# Patient Record
Sex: Male | Born: 1975 | Race: White | Hispanic: No | Marital: Married | Smoking: Never smoker
Health system: Southern US, Community
[De-identification: ages and names within clinical notes are randomized; demographics above are authoritative.]

## PROBLEM LIST (undated history)

## (undated) DIAGNOSIS — M94261 Chondromalacia, right knee: Secondary | ICD-10-CM

## (undated) DIAGNOSIS — J42 Unspecified chronic bronchitis: Secondary | ICD-10-CM

## (undated) DIAGNOSIS — J45909 Unspecified asthma, uncomplicated: Secondary | ICD-10-CM

## (undated) DIAGNOSIS — M545 Low back pain, unspecified: Secondary | ICD-10-CM

## (undated) DIAGNOSIS — S83249A Other tear of medial meniscus, current injury, unspecified knee, initial encounter: Secondary | ICD-10-CM

## (undated) DIAGNOSIS — M47815 Spondylosis without myelopathy or radiculopathy, thoracolumbar region: Secondary | ICD-10-CM

## (undated) DIAGNOSIS — F419 Anxiety disorder, unspecified: Secondary | ICD-10-CM

## (undated) DIAGNOSIS — I10 Essential (primary) hypertension: Secondary | ICD-10-CM

## (undated) DIAGNOSIS — J189 Pneumonia, unspecified organism: Secondary | ICD-10-CM

## (undated) DIAGNOSIS — G8929 Other chronic pain: Secondary | ICD-10-CM

## (undated) DIAGNOSIS — M479 Spondylosis, unspecified: Secondary | ICD-10-CM

## (undated) HISTORY — PX: BACK SURGERY: SHX140

## (undated) HISTORY — DX: Unspecified asthma, uncomplicated: J45.909

## (undated) HISTORY — DX: Anxiety disorder, unspecified: F41.9

## (undated) HISTORY — PX: WISDOM TOOTH EXTRACTION: SHX21

## (undated) HISTORY — PX: VASECTOMY: SHX75

---
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
## 2011-12-02 ENCOUNTER — Other Ambulatory Visit: Payer: Self-pay | Admitting: Orthopedic Surgery

## 2011-12-02 ENCOUNTER — Ambulatory Visit
Admission: RE | Admit: 2011-12-02 | Discharge: 2011-12-02 | Disposition: A | Discharge status: Home / Self Care | Payer: BC Managed Care – PPO | Source: Ambulatory Visit | Attending: Orthopedic Surgery | Admitting: Orthopedic Surgery

## 2011-12-02 DIAGNOSIS — Z77018 Contact with and (suspected) exposure to other hazardous metals: Secondary | ICD-10-CM

## 2013-06-08 ENCOUNTER — Encounter: Payer: Self-pay | Admitting: Neurology

## 2013-06-12 ENCOUNTER — Encounter: Payer: Self-pay | Admitting: Neurology

## 2013-06-12 ENCOUNTER — Ambulatory Visit (INDEPENDENT_AMBULATORY_CARE_PROVIDER_SITE_OTHER): Payer: 59 | Admitting: Neurology

## 2013-06-12 VITALS — BP 156/99 | HR 86 | Temp 96.4°F | Ht 70.0 in | Wt 192.0 lb

## 2013-06-12 DIAGNOSIS — R51 Headache: Secondary | ICD-10-CM

## 2013-06-12 DIAGNOSIS — G4733 Obstructive sleep apnea (adult) (pediatric): Secondary | ICD-10-CM

## 2013-06-12 DIAGNOSIS — G2581 Restless legs syndrome: Secondary | ICD-10-CM

## 2013-06-12 DIAGNOSIS — R519 Headache, unspecified: Secondary | ICD-10-CM

## 2013-06-12 DIAGNOSIS — M549 Dorsalgia, unspecified: Secondary | ICD-10-CM

## 2013-06-12 DIAGNOSIS — F1120 Opioid dependence, uncomplicated: Secondary | ICD-10-CM

## 2013-06-12 DIAGNOSIS — G8929 Other chronic pain: Secondary | ICD-10-CM

## 2013-06-12 DIAGNOSIS — R351 Nocturia: Secondary | ICD-10-CM

## 2013-06-12 DIAGNOSIS — F1190 Opioid use, unspecified, uncomplicated: Secondary | ICD-10-CM

## 2013-06-12 DIAGNOSIS — G4761 Periodic limb movement disorder: Secondary | ICD-10-CM

## 2013-06-12 DIAGNOSIS — J45909 Unspecified asthma, uncomplicated: Secondary | ICD-10-CM

## 2013-06-12 NOTE — Patient Instructions (Addendum)
Based on your symptoms and your exam I believe you are at risk for obstructive sleep apnea or OSA, and I think we should proceed with a sleep study to determine whether you do or do not have OSA and how severe it is. If you have more than mild OSA, I want you to consider treatment with CPAP. Please remember, the risks and ramifications of moderate to severe obstructive sleep apnea or OSA are: Cardiovascular disease, including congestive heart failure, stroke, difficult to control hypertension, arrhythmias, and even type 2 diabetes has been linked to untreated OSA. Sleep apnea causes disruption of sleep and sleep deprivation in most cases, which, in turn, can cause recurrent headaches, problems with memory, mood, concentration, focus, and vigilance. Most people with untreated sleep apnea report excessive daytime sleepiness, which can affect their ability to drive. Please do not drive if you feel sleepy.  I will see you back after your sleep study to go over the test results and where to go from there. We will call you after your sleep study and to set up an appointment at the time.   Cut back on your caffeine intake and consider stopping alcohol altogether as you are on narcotic pain medicine.
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
## 2013-07-04 ENCOUNTER — Telehealth: Payer: Self-pay | Admitting: *Deleted

## 2013-07-04 NOTE — Telephone Encounter (Signed)
Message copied by Daryll DrownHEUGLY, Flossie Wexler B on Wed Jul 04, 2013  2:26 PM ------      Message from: Waldron LabsLILLY, KISSA K      Created: Wed Jul 04, 2013  8:25 AM      Regarding: RE: Status on peer to peer       He has one of the [employer] insurance plans in which their policy requires a referral from the pcp for any visit to another office.  This plan is called a [employer] plan.  The pcp must visit the Comp[employer] online portal and enter the referral information and obtain a reference number.  The front desk is aware to flag the Compass insurance plans when the patient checks in and presents their insurance card.  However, he did not have his insurance card for his initial visit so they were unaware.  Since he was not referred by his pcp, but referred by Dr. Murray HodgkinsBartko, no referral was obtained.  According to billing, when the claim for payment for his consultation is submitted to [facility], it will then be rejected and the cost will become the patients responsibility.  For his visit, that cost will be $425.  It was not discovered that he had a Compass plan until [facility] denied the attended sleep study so I attempted to get coverage information for a HST, and was informed by the representative.  Per Dorathy DaftKayla, in billing - these denied claims will become those patients responsibility, however, you can check with Angie to see if there is any type of discount available.  His appointment was scheduled for 4/10 and he still does not know that its been canceled or received any type of information from our office regarding this - hope he handles it okay.      ----- Message -----         From: Bonita QuinShawnee Sabir Charters, RPSGT         Sent: 07/04/2013   7:50 AM           To: Berniece SalinesKissa K Lilly      Subject: RE: Status on peer to peer                               Good morning, so I need to contact this patient today.  Could you please tell me the details again so I can be very clear in speaking with him about it.  This is my understanding, I just want to clarify:   -He has a type of [employer] which requires an online referral for every service which must be entered in an online portal by his primary care physician who is basically the gatekeeper for ALL services.      -He may not have known that and we didn't either because we haven't worked with this plan until recently.  He didn't have his insurance card and that further complicated things.      - Now his visit with Dr. Frances FurbishAthar has been denied by his insurance because the online referral wasn't entered prior to the visit.      - Responsibility for this visit which is upwards of $400 has been transferred to him.      - Is there any way we can reduce this amount...convert it to a cash pay amount or something like that after the fact??      - In order to get anything covered in the future, he would be required to  see his PCP first who would then have to enter the referral in the online portal.  Do you know if there's any way we can verify when a referral has been entered for him prior to any services he has in the future?      - Also, when did we find this out?  Was it all on the phone when we were trying to get a prior auth for the sleep study?            Thanks for any answers you can help me with, I just want to be as clear as I can be while I have him on the phone.      ----- Message -----         From: [employer]         Sent: 06/29/2013  12:00 PM           To: Bonita Quin, RPSGT      Subject: RE: Status on peer to peer                               No, they don't allow [place] to do that once they've made a decision on those particular procedure codes.  We have to do a peer to peer or the patient can file an appeal, but the appeal has to be submitted in writing and it can be a lengthy process.      ----- Message -----         From: Bonita Quin, RPSGT         Sent: 06/29/2013   6:58 AM           To: [employer]      Subject: RE: Status on peer to peer                               I know you left it on my  desk but I haven't heard anything back from Dr. Frances Furbish.  I was out of office Wednesday when she was reading and I can ask her today during read time what she would like to do with it.  If it is too late, do you know if we are able to resubmit the auth request drawing attention to this information initially before a second decision is made?      ----- Message -----         From: [employer]         Sent: 06/28/2013   2:55 PM           To: Bonita Quin, RPSGT      Subject: Status on peer to peer                                   Do you know the status on this patient?  It may be too late for a peer to peer.  Just checking because his appt is scheduled for next week and he has not been notified by our office about anything.                           ------

## 2013-07-04 NOTE — Telephone Encounter (Signed)
Called and spoke with Steven Murillo and she stated they had just become aware of this entire Comp[employer] process.  I explained that Steven Murillo would be calling to explain the cash pay discount for the visit which had already occurred and that we are able to accept payment arrangements if needed.  She said Steven Murillo has to have an MRI, and they want to do back surgery.  She said the auth process for the MRI took about a week after the PCP had entered it.  I explained that once the PCP had entered the referral into the portal, that we could try again with the authorization process and then reschedule.  I also explained we would do all we could to expedite his scheduling knowing he will have to have the sleep study before any surgery can be completed.  I can take care of his study on a Sunday night if needed.  They are appreciative of our efforts and they will call Steven Murillo once they can confirm the referral has been entered into the [employer] provider portal.

## 2013-09-20 ENCOUNTER — Other Ambulatory Visit: Payer: Self-pay | Admitting: Orthopaedic Surgery

## 2013-09-21 ENCOUNTER — Encounter: Payer: Self-pay | Admitting: Internal Medicine

## 2013-09-21 ENCOUNTER — Encounter (HOSPITAL_BASED_OUTPATIENT_CLINIC_OR_DEPARTMENT_OTHER): Payer: Self-pay | Admitting: *Deleted

## 2013-09-21 ENCOUNTER — Ambulatory Visit (INDEPENDENT_AMBULATORY_CARE_PROVIDER_SITE_OTHER): Payer: 59 | Admitting: Internal Medicine

## 2013-09-21 VITALS — BP 142/78 | HR 84 | Temp 97.5°F | Ht 68.0 in | Wt 189.0 lb

## 2013-09-21 DIAGNOSIS — M549 Dorsalgia, unspecified: Secondary | ICD-10-CM

## 2013-09-21 DIAGNOSIS — R5381 Other malaise: Secondary | ICD-10-CM

## 2013-09-21 DIAGNOSIS — R0989 Other specified symptoms and signs involving the circulatory and respiratory systems: Secondary | ICD-10-CM

## 2013-09-21 DIAGNOSIS — R5383 Other fatigue: Secondary | ICD-10-CM

## 2013-09-21 DIAGNOSIS — M25561 Pain in right knee: Secondary | ICD-10-CM

## 2013-09-21 DIAGNOSIS — J4530 Mild persistent asthma, uncomplicated: Secondary | ICD-10-CM

## 2013-09-21 DIAGNOSIS — M25569 Pain in unspecified knee: Secondary | ICD-10-CM

## 2013-09-21 DIAGNOSIS — R0609 Other forms of dyspnea: Secondary | ICD-10-CM

## 2013-09-21 DIAGNOSIS — J45909 Unspecified asthma, uncomplicated: Secondary | ICD-10-CM

## 2013-09-21 DIAGNOSIS — R0683 Snoring: Secondary | ICD-10-CM

## 2013-09-21 DIAGNOSIS — G8929 Other chronic pain: Secondary | ICD-10-CM

## 2013-09-21 MED ORDER — CELECOXIB 200 MG PO CAPS
200.0000 mg | ORAL_CAPSULE | Freq: Every day | ORAL | Status: DC
Start: 2013-09-21 — End: 2013-10-23

## 2013-09-21 NOTE — Assessment & Plan Note (Signed)
On albuterol and advair No current issues

## 2013-09-21 NOTE — Assessment & Plan Note (Signed)
Waiting for surgery Needs sleep study prior Will refer to pulm

## 2013-09-21 NOTE — Progress Notes (Signed)
HPI  Pt presents to the clinic today to establish care. [facility] He reports he has a torn meniscus of the right knee. He is supposed to have surgery next week. He also has a history of chronic back pain s/p ruptured disk at age 38. He did have surgical intervention at this time but has had worsening pain over the last few years. He will be having repeat back surgery after his knee surgery but his surgeon request that he be referred for a sleep study prior to scheduling the back surgery. His partner reports that he does snore, falls asleep easily during the day and has stopped breathing at night. He will also need a refill of his celebrex today. Other than that, he has no concerns.  Flu: 12/2012 Tetanus: 2013 Eye Doctor: as needed Dentist: as needed  Past Medical History  Diagnosis Date  . Spinal stenosis, lumbar region, without neurogenic claudication   . Thoracic or lumbosacral neuritis or radiculitis, unspecified   . Displacement of lumbar intervertebral disc without myelopathy   . Opioid type dependence, continuous   . Snoring   . Asthma   . Insomnia   . Anxiety   . Chronic back pain 06/12/2013  . Chronic, continuous use of opioids 06/12/2013  . Alcohol abuse     Current Outpatient Prescriptions  Medication Sig Dispense Refill  . ALBUTEROL IN Inhale 1 spray into the lungs as needed.      . celecoxib (CELEBREX) 200 MG capsule Take 200 mg by mouth daily.      . Citalopram Hydrobromide (CELEXA PO) Take 1 tablet by mouth. Take 1 tab (40 mg) daily      . Fluticasone-Salmeterol (ADVAIR HFA IN) Inhale 1 spray into the lungs as needed.      . OxyCODONE HCl (OXYCONTIN PO) Take 1 tablet by mouth as needed.       No current facility-administered medications for this visit.    No Known Allergies  Family History  Problem Relation Age of Onset  . Sleep apnea Father   . Diabetes Father   . Heart disease Father   . Alcohol abuse Brother   .  Alcohol abuse Paternal Grandfather     History   Social History  . Marital Status: Married    Spouse Name: N/A    Number of Children: N/A  . Years of Education: N/A   Occupational History  . Not on file.   Social History Main Topics  . Smoking status: Never Smoker   . Smokeless tobacco: Never Used  . Alcohol Use: No  . Drug Use: Yes    Special: Marijuana  . Sexual Activity: Yes   Other Topics Concern  . Not on file   Social History Narrative  . No narrative on file    ROS:  Constitutional: Pt reports fatigue. Denies fever, malaise,  headache or abrupt weight changes.  Musculoskeletal: Pt reports right knee pain and chronic back pain. Denies muscle pain.  Psych: Pt reports anxiety. Denies depression, SI/HI.  No other specific complaints in a complete review of systems (except as listed in HPI above).  PE:  BP 142/78  Pulse 84  Temp(Src) 97.5 F (36.4 C) (Oral)  Ht 5\' 8"  (1.727 m)  Wt 189 lb (85.73 kg)  BMI 28.74 kg/m2  SpO2 99% Wt Readings from Last 3 Encounters:  09/21/13 189 lb (85.73 kg)  06/12/13 192 lb (87.091 kg)    General: Appears his stated age,  well developed, well nourished in NAD. Cardiovascular: Normal rate and rhythm. S1,S2 noted.  No murmur, rubs or gallops noted. No JVD or BLE edema. No carotid bruits noted. Pulmonary/Chest: Normal effort and positive vesicular breath sounds. No respiratory distress. No wheezes, rales or ronchi noted.  Musculoskeletal: Right knee is in a brace. Decreased flexion and extension of the pain. Ataxic gait. Strength 4/5 RLE, 5/5 LLE. Psychiatric: Mood anxiousand affect flat. Behavior is normal. Judgment and thought content normal.     Assessment and Plan:  Fatigue, snoring:  Will refer to pulmonology for possible sleep study.  RTC as needed

## 2013-09-21 NOTE — Patient Instructions (Addendum)
Sleep Apnea  Sleep apnea is a sleep disorder characterized by abnormal pauses in breathing while you sleep. When your breathing pauses, the level of oxygen in your blood decreases. This causes you to move out of deep sleep and into light sleep. As a result, your quality of sleep is poor, and the system that carries your blood throughout your body (cardiovascular system) experiences stress. If sleep apnea remains untreated, the following conditions can develop:  High blood pressure (hypertension).  Coronary artery disease.  Inability to achieve or maintain an erection (impotence).  Impairment of your thought process (cognitive dysfunction). There are three types of sleep apnea: 1. Obstructive sleep apnea--Pauses in breathing during sleep because of a blocked airway. 2. Central sleep apnea--Pauses in breathing during sleep because the area of the brain that controls your breathing does not send the correct signals to the muscles that control breathing. 3. Mixed sleep apnea--A combination of both obstructive and central sleep apnea. RISK FACTORS The following risk factors can increase your risk of developing sleep apnea:  Being overweight.  Smoking.  Having narrow passages in your nose and throat.  Being of older age.  Being male.  Alcohol use.  Sedative and tranquilizer use.  Ethnicity. Among individuals younger than 35 years, African Americans are at increased risk of sleep apnea. SYMPTOMS   Difficulty staying asleep.  Daytime sleepiness and fatigue.  Loss of energy.  Irritability.  Loud, heavy snoring.  Morning headaches.  Trouble concentrating.  Forgetfulness.  Decreased interest in sex. DIAGNOSIS  In order to diagnose sleep apnea, your caregiver will perform a physical examination. Your caregiver may suggest that you take a home sleep test. Your caregiver may also recommend that you spend the night in a sleep lab. In the sleep lab, several monitors record  information about your heart, lungs, and brain while you sleep. Your leg and arm movements and blood oxygen level are also recorded. TREATMENT The following actions may help to resolve mild sleep apnea:  Sleeping on your side.   Using a decongestant if you have nasal congestion.   Avoiding the use of depressants, including alcohol, sedatives, and narcotics.   Losing weight and modifying your diet if you are overweight. There also are devices and treatments to help open your airway:  Oral appliances. These are custom-made mouthpieces that shift your lower jaw forward and slightly open your bite. This opens your airway.  Devices that create positive airway pressure. This positive pressure "splints" your airway open to help you breathe better during sleep. The following devices create positive airway pressure:  Continuous positive airway pressure (CPAP) device. The CPAP device creates a continuous level of air pressure with an air pump. The air is delivered to your airway through a mask while you sleep. This continuous pressure keeps your airway open.  Nasal expiratory positive airway pressure (EPAP) device. The EPAP device creates positive air pressure as you exhale. The device consists of single-use valves, which are inserted into each nostril and held in place by adhesive. The valves create very little resistance when you inhale but create much more resistance when you exhale. That increased resistance creates the positive airway pressure. This positive pressure while you exhale keeps your airway open, making it easier to breath when you inhale again.  Bilevel positive airway pressure (BPAP) device. The BPAP device is used mainly in patients with central sleep apnea. This device is similar to the CPAP device because it also uses an air pump to deliver continuous air pressure   through a mask. However, with the BPAP machine, the pressure is set at two different levels. The pressure when you  exhale is lower than the pressure when you inhale.  Surgery. Typically, surgery is only done if you cannot comply with less invasive treatments or if the less invasive treatments do not improve your condition. Surgery involves removing excess tissue in your airway to create a wider passage way. Document Released: 03/05/2002 Document Revised: 07/10/2012 Document Reviewed: 07/22/2011 [employer] Patient Information 2015 [employer]. This information is not intended to replace advice given to you by your health care provider. Make sure you discuss any questions you have with your health care provider.  

## 2013-09-21 NOTE — Assessment & Plan Note (Signed)
Scheduled for surgery next week Torn meniscus On oxycodone Will refill celebrex today

## 2013-09-21 NOTE — Pre-Procedure Instructions (Signed)
Bring all medications especially inhalers. Pack an overnight bag in case needs to stay the night - questionable sleep apnea. Pt to have a sleep study end of July.

## 2013-09-21 NOTE — Progress Notes (Signed)
Pre visit review using our clinic review tool, if applicable. No additional management support is needed unless otherwise documented below in the visit note. 

## 2013-09-26 DIAGNOSIS — S83249A Other tear of medial meniscus, current injury, unspecified knee, initial encounter: Secondary | ICD-10-CM

## 2013-09-26 DIAGNOSIS — M94261 Chondromalacia, right knee: Secondary | ICD-10-CM

## 2013-09-26 HISTORY — DX: Other tear of medial meniscus, current injury, unspecified knee, initial encounter: S83.249A

## 2013-09-26 HISTORY — DX: Chondromalacia, right knee: M94.261

## 2013-09-26 NOTE — H&P (Signed)
Steven Murillo is an [id number] y.o. male.   Chief Complaint: Right knee pain HPI: Steven Murillo is 38 years old and has had 4-5 months of right knee pain after twisting is in the helping his brother.  He is complaining of occasional locking episodes at this time feels like there is a catch in his knee.  He has been wearing a brace and limiting his activities, so this does not happen.  He does have pain and nighttime and has been on anti-inflammatory medicines and pain medicines minimal benefit.Recent MRI scan done on 07/06/13 shows a bucket-handle tear medial meniscus.  We have discussed proceeding with arthroscopy to alleviate pain and improve function.  Past Medical History  Diagnosis Date  . Spinal stenosis, lumbar region, without neurogenic claudication   . Thoracic or lumbosacral neuritis or radiculitis, unspecified   . Displacement of lumbar intervertebral disc without myelopathy   . Opioid type dependence, continuous   . Snoring   . Asthma   . Insomnia   . Anxiety   . Chronic back pain 06/12/2013  . Chronic, continuous use of opioids 06/12/2013  . Alcohol abuse     Past Surgical History  Procedure Laterality Date  . Lumbar spine surgery  02/05/2013  . Back surgery  1991  . Tooth extraction    . Vasectomy      Family History  Problem Relation Age of Onset  . Sleep apnea Father   . Diabetes Father   . Heart disease Father   . Alcohol abuse Brother   . Alcohol abuse Paternal Grandfather    Social History:  reports that he has never smoked. He has never used smokeless tobacco. He reports that he uses illicit drugs (Marijuana). He reports that he does not drink alcohol.  Allergies: No Known Allergies  No prescriptions prior to admission    No results found for this or any previous visit (from the past 48 hour(s)). No results found.  Review of Systems  Constitutional: Negative.   HENT: Negative.   Eyes: Negative.   Respiratory: Negative.   Cardiovascular: Negative.    Gastrointestinal: Negative.   Genitourinary: Negative.   Musculoskeletal: Positive for joint pain.  Skin: Negative.   Neurological: Negative.   Endo/Heme/Allergies: Negative.   Psychiatric/Behavioral: Negative.     Height 5\' 8"  (1.727 m), weight 85.73 kg (189 lb). Physical Exam  Constitutional: He appears well-nourished.  HENT:  Head: Normocephalic.  Eyes: Pupils are equal, round, and reactive to light.  Neck: Normal range of motion.  Cardiovascular: Normal rate.   Respiratory: Effort normal.  GI: Soft.  Musculoskeletal:  Right knee exam: Trace effusion.  Motion 0-1 30.  He does have some apprehension.  Trace crepitation.  Pain over medial joint line.  Good ligamentous and stable ACL, MCL LCL and PCL  Neurological: He is alert.  Skin: Skin is warm.  Psychiatric: He has a normal mood and affect.     Assessment/Plan Assessment: Right knee torn medial meniscus by MRI scan. Plan: Steven Murillo does have a right knee medial meniscus tear that requires an arthroscopy to previous function and decrease his pain.  I've discussed with him the risk of anesthesia, infection and DVT associated with this type procedure.  We have also discussed time out of work and potential need for physical therapy to optimize his results.  Amberle Lyter R 09/26/2013, 5:36 PM

## 2013-09-27 ENCOUNTER — Encounter (HOSPITAL_BASED_OUTPATIENT_CLINIC_OR_DEPARTMENT_OTHER): Payer: Self-pay | Admitting: Certified Registered"

## 2013-09-27 ENCOUNTER — Encounter (HOSPITAL_BASED_OUTPATIENT_CLINIC_OR_DEPARTMENT_OTHER)
Admission: RE | Disposition: A | Discharge status: Home / Self Care | Payer: Self-pay | Source: Ambulatory Visit | Attending: Orthopaedic Surgery

## 2013-09-27 ENCOUNTER — Encounter (HOSPITAL_BASED_OUTPATIENT_CLINIC_OR_DEPARTMENT_OTHER): Payer: Self-pay | Admitting: *Deleted

## 2013-09-27 ENCOUNTER — Ambulatory Visit (HOSPITAL_BASED_OUTPATIENT_CLINIC_OR_DEPARTMENT_OTHER)
Admission: RE | Admit: 2013-09-27 | Discharge: 2013-09-27 | Disposition: A | Discharge status: Home / Self Care | Payer: 59 | Source: Ambulatory Visit | Attending: Orthopaedic Surgery | Admitting: Orthopaedic Surgery

## 2013-09-27 ENCOUNTER — Ambulatory Visit (INDEPENDENT_AMBULATORY_CARE_PROVIDER_SITE_OTHER): Payer: 59 | Admitting: Family Medicine

## 2013-09-27 VITALS — BP 152/102 | HR 96 | Temp 98.5°F | Resp 18 | Ht 68.0 in | Wt 191.0 lb

## 2013-09-27 DIAGNOSIS — I10 Essential (primary) hypertension: Secondary | ICD-10-CM | POA: Insufficient documentation

## 2013-09-27 DIAGNOSIS — Z5309 Procedure and treatment not carried out because of other contraindication: Secondary | ICD-10-CM | POA: Insufficient documentation

## 2013-09-27 DIAGNOSIS — M23205 Derangement of unspecified medial meniscus due to old tear or injury, unspecified knee: Secondary | ICD-10-CM | POA: Insufficient documentation

## 2013-09-27 LAB — COMPREHENSIVE METABOLIC PANEL
ALT: 29 U/L (ref 0–53)
AST: 20 U/L (ref 0–37)
Albumin: 4.7 g/dL (ref 3.5–5.2)
Alkaline Phosphatase: 75 U/L (ref 39–117)
BILIRUBIN TOTAL: 0.3 mg/dL (ref 0.2–1.2)
BUN: 12 mg/dL (ref 6–23)
CALCIUM: 9.9 mg/dL (ref 8.4–10.5)
CO2: 31 meq/L (ref 19–32)
CREATININE: 0.84 mg/dL (ref 0.50–1.35)
Chloride: 103 mEq/L (ref 96–112)
GLUCOSE: 99 mg/dL (ref 70–99)
Potassium: 4.6 mEq/L (ref 3.5–5.3)
Sodium: 140 mEq/L (ref 135–145)
TOTAL PROTEIN: 7.5 g/dL (ref 6.0–8.3)

## 2013-09-27 LAB — CBC
HEMATOCRIT: 40.6 % (ref 39.0–52.0)
Hemoglobin: 14.2 g/dL (ref 13.0–17.0)
MCH: 28.3 pg (ref 26.0–34.0)
MCHC: 35 g/dL (ref 30.0–36.0)
MCV: 80.9 fL (ref 78.0–100.0)
Platelets: 322 10*3/uL (ref 150–400)
RBC: 5.02 MIL/uL (ref 4.22–5.81)
RDW: 13.6 % (ref 11.5–15.5)
WBC: 9.9 10*3/uL (ref 4.0–10.5)

## 2013-09-27 SURGERY — ARTHROSCOPY, KNEE
Anesthesia: Choice | Site: Knee | Laterality: Right

## 2013-09-27 MED ORDER — METHYLPREDNISOLONE ACETATE 80 MG/ML IJ SUSP
INTRAMUSCULAR | Status: AC
  Filled 2013-09-27: qty 1

## 2013-09-27 MED ORDER — FENTANYL CITRATE 0.05 MG/ML IJ SOLN
INTRAMUSCULAR | Status: AC
  Filled 2013-09-27: qty 4

## 2013-09-27 MED ORDER — BUPIVACAINE-EPINEPHRINE (PF) 0.5% -1:200000 IJ SOLN
INTRAMUSCULAR | Status: AC
  Filled 2013-09-27: qty 30

## 2013-09-27 MED ORDER — PROPOFOL 10 MG/ML IV EMUL
INTRAVENOUS | Status: AC
  Filled 2013-09-27: qty 50

## 2013-09-27 MED ORDER — HYDROCHLOROTHIAZIDE 25 MG PO TABS: 25.0000 mg | ORAL_TABLET | Freq: Every day | ORAL | Status: DC

## 2013-09-27 MED ORDER — SUCCINYLCHOLINE CHLORIDE 20 MG/ML IJ SOLN
INTRAMUSCULAR | Status: AC
  Filled 2013-09-27: qty 1

## 2013-09-27 MED ORDER — MIDAZOLAM HCL 2 MG/2ML IJ SOLN
INTRAMUSCULAR | Status: AC
  Filled 2013-09-27: qty 2

## 2013-09-27 MED ORDER — METHYLPREDNISOLONE ACETATE 40 MG/ML IJ SUSP
INTRAMUSCULAR | Status: AC
  Filled 2013-09-27: qty 1

## 2013-09-27 SURGICAL SUPPLY — 42 items
BANDAGE ELASTIC 6 VELCRO ST LF (GAUZE/BANDAGES/DRESSINGS) ×1 IMPLANT
BLADE CUDA 5.5 (BLADE) IMPLANT
BLADE GREAT WHITE 4.2 (BLADE) ×1 IMPLANT
BLADE GREAT WHITE 4.2MM (BLADE)
BNDG GAUZE ELAST 4 BULKY (GAUZE/BANDAGES/DRESSINGS) ×1 IMPLANT
CANISTER SUCT 3000ML (MISCELLANEOUS) IMPLANT
DRAPE ARTHROSCOPY W/POUCH 114 (DRAPES) ×1 IMPLANT
DRAPE U-SHAPE 47X51 STRL (DRAPES) ×1 IMPLANT
DRSG EMULSION OIL 3X3 NADH (GAUZE/BANDAGES/DRESSINGS) ×1 IMPLANT
DURAPREP 26ML APPLICATOR (WOUND CARE) ×1 IMPLANT
ELECT MENISCUS 165MM 90D (ELECTRODE) IMPLANT
ELECT REM PT RETURN 9FT ADLT (ELECTROSURGICAL)
ELECTRODE REM PT RTRN 9FT ADLT (ELECTROSURGICAL) IMPLANT
GAUZE SPONGE 4X4 12PLY STRL (GAUZE/BANDAGES/DRESSINGS) ×1 IMPLANT
GLOVE BIO SURGEON STRL SZ8 (GLOVE) ×1 IMPLANT
GLOVE BIO SURGEON STRL SZ8.5 (GLOVE) ×1 IMPLANT
GLOVE BIOGEL PI IND STRL 8 (GLOVE) ×2 IMPLANT
GLOVE BIOGEL PI IND STRL 8.5 (GLOVE) IMPLANT
GLOVE BIOGEL PI INDICATOR 8 (GLOVE)
GLOVE BIOGEL PI INDICATOR 8.5 (GLOVE)
GLOVE SS BIOGEL STRL SZ 8 (GLOVE) IMPLANT
GLOVE SUPERSENSE BIOGEL SZ 8 (GLOVE)
GLOVE SURG SS PI 8.0 STRL IVOR (GLOVE) ×1 IMPLANT
GOWN STRL REUS W/ TWL LRG LVL3 (GOWN DISPOSABLE) ×1 IMPLANT
GOWN STRL REUS W/ TWL XL LVL3 (GOWN DISPOSABLE) ×2 IMPLANT
GOWN STRL REUS W/TWL LRG LVL3 (GOWN DISPOSABLE)
GOWN STRL REUS W/TWL XL LVL3 (GOWN DISPOSABLE)
IV NS IRRIG 3000ML ARTHROMATIC (IV SOLUTION) IMPLANT
KNEE WRAP E Z 3 GEL PACK (MISCELLANEOUS) ×1 IMPLANT
MANIFOLD NEPTUNE II (INSTRUMENTS) IMPLANT
PACK ARTHROSCOPY DSU (CUSTOM PROCEDURE TRAY) ×1 IMPLANT
PACK BASIN DAY SURGERY FS (CUSTOM PROCEDURE TRAY) ×1 IMPLANT
PENCIL BUTTON HOLSTER BLD 10FT (ELECTRODE) IMPLANT
SET ARTHROSCOPY TUBING (MISCELLANEOUS)
SET ARTHROSCOPY TUBING LN (MISCELLANEOUS) ×1 IMPLANT
SHEET MEDIUM DRAPE 40X70 STRL (DRAPES) ×1 IMPLANT
SYRINGE 3CC/18X1.5 ECLIPSE (MISCELLANEOUS) IMPLANT
TOWEL OR 17X24 6PK STRL BLUE (TOWEL DISPOSABLE) ×1 IMPLANT
TOWEL OR NON WOVEN STRL DISP B (DISPOSABLE) ×1 IMPLANT
WAND 30 DEG SABER W/CORD (SURGICAL WAND) IMPLANT
WAND STAR VAC 90 (SURGICAL WAND) IMPLANT
WATER STERILE IRR 1000ML POUR (IV SOLUTION) ×1 IMPLANT

## 2013-09-27 NOTE — Progress Notes (Signed)
   Subjective:    Patient ID: Steven Murillo, male    DOB: 10/17/1975, 38 y.o.   MRN: [id number]  HPI Patient reports here today, coming from surgery center. He was supposed to have arthroscopic knee surgery today. His blood pressure was found to be elevated with readings of R- 170/119, L- 161/117, R arm 15 minutes later- 171/128. He did not take any medications this morning in anticipation of surgery. He used his albuterol last night. He admits to having high blood pressure associated with anxiety in the past.   Blood pressure here 130/76 with a large cuff and 152/102 with regular cuff (appropriate size). Blood pressure noted to be 156/99 on 06/12/13.  He has an appointment for sleep study to evaluate OSA.  Past Medical History  Diagnosis Date  . Spinal stenosis, lumbar region, without neurogenic claudication   . Thoracic or lumbosacral neuritis or radiculitis, unspecified   . Displacement of lumbar intervertebral disc without myelopathy   . Opioid type dependence, continuous   . Snoring   . Asthma   . Insomnia   . Anxiety   . Chronic back pain 06/12/2013  . Chronic, continuous use of opioids 06/12/2013  . Alcohol abuse    Past Surgical History  Procedure Laterality Date  . Lumbar spine surgery  02/05/2013  . Back surgery  1991  . Tooth extraction    . Vasectomy     FH- mother healthy, father with DM SH- no tobacco, ETOH- 4x/week, marijuana 1-2x/week, caffeine x 10/day  Review of Systems No chest pain, no SOB, occasional headaches. No swelling of ankles/legs.    Objective:   Physical Exam  Vitals reviewed. Constitutional: He is oriented to person, place, and time. He appears well-developed and well-nourished.  HENT:  Head: Normocephalic and atraumatic.  Eyes: Conjunctivae are normal. Right eye exhibits no discharge. Left eye exhibits no discharge.  Neck: Normal range of motion. Neck supple.  Cardiovascular: Normal rate and regular rhythm.   Pulmonary/Chest: Effort  normal and breath sounds normal.  Neurological: He is alert and oriented to person, place, and time.  Skin: Skin is warm and dry.  Psychiatric: He has a normal mood and affect. His behavior is normal. Judgment and thought content normal.      Assessment & Plan:  1. HTN (hypertension), benign - CBC - Comprehensive metabolic panel - hydrochlorothiazide (HYDRODIURIL) 25 MG tablet; Take 1 tablet (25 mg total) by mouth daily.  Dispense: 30 tablet; Refill: 2 -patient to follow up with Dr. Nolon Nationsalldorf's office today regarding rescheduling surgery -patient to follow up with PCP in 1-2 weeks -Will monitor BP at home occasionally and keep record -Written and verbal information regarding HTN provided.   Emi Belfasteborah B. Elaura Calix, FNP-BC  [facility]  09/27/2013 12:51 PM

## 2013-09-27 NOTE — Progress Notes (Signed)
Pt adm with elevated BP. Notified anesthesia (Dr. Ivin Bootyrews) who discussed with pt and cancelled surgery. Dr. Jerl Santosalldorf notified. Pt to follow up with primary caregiver immediately upon discharge

## 2013-09-27 NOTE — Patient Instructions (Signed)
Hypertension Hypertension, commonly called high blood pressure, is when the force of blood pumping through your arteries is too strong. Your arteries are the blood vessels that carry blood from your heart throughout your body. A blood pressure reading consists of a higher number over a lower number, such as 110/72. The higher number (systolic) is the pressure inside your arteries when your heart pumps. The lower number (diastolic) is the pressure inside your arteries when your heart relaxes. Ideally you want your blood pressure below 120/80. Hypertension forces your heart to work harder to pump blood. Your arteries may become narrow or stiff. Having hypertension puts you at risk for heart disease, stroke, and other problems.  RISK FACTORS Some risk factors for high blood pressure are controllable. Others are not.  Risk factors you cannot control include:   Race. You may be at higher risk if you are African American.  Age. Risk increases with age.  Gender. Men are at higher risk than women before age 45 years. After age 65, women are at higher risk than men. Risk factors you can control include:  Not getting enough exercise or physical activity.  Being overweight.  Getting too much fat, sugar, calories, or salt in your diet.  Drinking too much alcohol. SIGNS AND SYMPTOMS Hypertension does not usually cause signs or symptoms. Extremely high blood pressure (hypertensive crisis) may cause headache, anxiety, shortness of breath, and nosebleed. DIAGNOSIS  To check if you have hypertension, your health care provider will measure your blood pressure while you are seated, with your arm held at the level of your heart. It should be measured at least twice using the same arm. Certain conditions can cause a difference in blood pressure between your right and left arms. A blood pressure reading that is higher than normal on one occasion does not mean that you need treatment. If one blood pressure reading  is high, ask your health care provider about having it checked again. TREATMENT  Treating high blood pressure includes making lifestyle changes and possibly taking medication. Living a healthy lifestyle can help lower high blood pressure. You may need to change some of your habits. Lifestyle changes may include:  Following the DASH diet. This diet is high in fruits, vegetables, and whole grains. It is low in salt, red meat, and added sugars.  Getting at least 2 1/2 hours of brisk physical activity every week.  Losing weight if necessary.  Not smoking.  Limiting alcoholic beverages.  Learning ways to reduce stress. If lifestyle changes are not enough to get your blood pressure under control, your health care provider may prescribe medicine. You may need to take more than one. Work closely with your health care provider to understand the risks and benefits. HOME CARE INSTRUCTIONS  Have your blood pressure rechecked as directed by your health care provider.   Only take medicine as directed by your health care provider. Follow the directions carefully. Blood pressure medicines must be taken as prescribed. The medicine does not work as well when you skip doses. Skipping doses also puts you at risk for problems.   Do not smoke.   Monitor your blood pressure at home as directed by your health care provider. SEEK MEDICAL CARE IF:   You think you are having a reaction to medicines taken.  You have recurrent headaches or feel dizzy.  You have swelling in your ankles.  You have trouble with your vision. SEEK IMMEDIATE MEDICAL CARE IF:  You develop a severe headache or   confusion.  You have unusual weakness, numbness, or feel faint.  You have severe chest or abdominal pain.  You vomit repeatedly.  You have trouble breathing. MAKE SURE YOU:   Understand these instructions.  Will watch your condition.  Will get help right away if you are not doing well or get  worse. Document Released: 03/15/2005 Document Revised: 03/20/2013 Document Reviewed: 01/05/2013 [employer] Patient Information 2015 [employer]. This information is not intended to replace advice given to you by your health care provider. Make sure you discuss any questions you have with your health care provider.  

## 2013-09-27 NOTE — Progress Notes (Signed)
Pt presented for knee arthroscopy today with BP of 128 diastolic and a hx of HTN that has been untreated for several years.  I explained that it was unsafe to proceed with his operation and anesthetic until he gets his HRN under control.  I will notify Dr. Jerl Santosalldorf and have asked the pt to see his MD ASAP.  I have offered him to check him the day before future scheduled surgery for BP.

## 2013-10-03 ENCOUNTER — Ambulatory Visit (INDEPENDENT_AMBULATORY_CARE_PROVIDER_SITE_OTHER): Payer: 59 | Admitting: Internal Medicine

## 2013-10-03 ENCOUNTER — Encounter: Payer: Self-pay | Admitting: Internal Medicine

## 2013-10-03 ENCOUNTER — Telehealth: Payer: Self-pay | Admitting: Internal Medicine

## 2013-10-03 VITALS — BP 126/82 | HR 79 | Temp 98.2°F | Wt 192.0 lb

## 2013-10-03 DIAGNOSIS — F411 Generalized anxiety disorder: Secondary | ICD-10-CM

## 2013-10-03 DIAGNOSIS — I10 Essential (primary) hypertension: Secondary | ICD-10-CM

## 2013-10-03 MED ORDER — CITALOPRAM HYDROBROMIDE 40 MG PO TABS: 40.0000 mg | ORAL_TABLET | Freq: Every day | ORAL | Status: DC

## 2013-10-03 MED ORDER — HYDROCHLOROTHIAZIDE 25 MG PO TABS: 25.0000 mg | ORAL_TABLET | Freq: Every day | ORAL | Status: DC

## 2013-10-03 NOTE — Progress Notes (Signed)
Pre visit review using our clinic review tool, if applicable. No additional management support is needed unless otherwise documented below in the visit note. 

## 2013-10-03 NOTE — Progress Notes (Signed)
Subjective:    Patient ID: Steven Murillo, male    DOB: 10/26/1975, 38 y.o.   MRN: [id number]  HPI  Pt presents to the clinic today to follow up blood pressure. He was supposed to have knee surgery 09/27/13. At preop, his blood pressure was elevated. The anesthesiologist was concerned about providing anesthesia with a blood pressure of 170/118. Surgery was cancelled and he was sent to [facility] where he was started on HCTZ. He reports that he feels like he is sweating more since starting on this medication.His BP today is 126/82. Highest BP reading at home has been 149/104. Lowest is 129/83.  Also, he request a refill of his celexa. He reports the medication is effective.  Review of Systems  Past Medical History  Diagnosis Date  . Spinal stenosis, lumbar region, without neurogenic claudication   . Thoracic or lumbosacral neuritis or radiculitis, unspecified   . Displacement of lumbar intervertebral disc without myelopathy   . Opioid type dependence, continuous   . Snoring   . Asthma   . Insomnia   . Anxiety   . Chronic back pain 06/12/2013  . Chronic, continuous use of opioids 06/12/2013  . Alcohol abuse     Current Outpatient Prescriptions  Medication Sig Dispense Refill  . ALBUTEROL IN Inhale 1 spray into the lungs as needed.      . celecoxib (CELEBREX) 200 MG capsule Take 1 capsule (200 mg total) by mouth daily.  30 capsule  2  . Citalopram Hydrobromide (CELEXA PO) Take 1 tablet by mouth. Take 1 tab (40 mg) daily      . Fluticasone-Salmeterol (ADVAIR HFA IN) Inhale 1 spray into the lungs as needed.      . hydrochlorothiazide (HYDRODIURIL) 25 MG tablet Take 1 tablet (25 mg total) by mouth daily.  30 tablet  2  . OxyCODONE HCl (OXYCONTIN PO) Take 1 tablet by mouth as needed.       No current facility-administered medications for this visit.    No Known Allergies  Family History  Problem Relation Age of Onset  . Sleep apnea Father   . Diabetes Father   . Heart disease Father     . Alcohol abuse Brother   . Alcohol abuse Paternal Grandfather     History   Social History  . Marital Status: Married    Spouse Name: N/A    Number of Children: N/A  . Years of Education: N/A   Occupational History  . Not on file.   Social History Main Topics  . Smoking status: Never Smoker   . Smokeless tobacco: Never Used  . Alcohol Use: No     Comment: stopped drinking 1 year ago  . Drug Use: Yes    Special: Marijuana     Comment: smoked marijuana 1 month ago  . Sexual Activity: Yes   Other Topics Concern  . Not on file   Social History Narrative  . No narrative on file     Constitutional: Denies fever, malaise, fatigue, headache or abrupt weight changes.  Respiratory: Denies difficulty breathing, shortness of breath, cough or sputum production.   Cardiovascular: Denies chest pain, chest tightness, palpitations or swelling in the hands or feet.  Neurological: Denies dizziness, difficulty with memory, difficulty with speech or problems with balance and coordination.   No other specific complaints in a complete review of systems (except as listed in HPI above).     Objective:   Physical Exam  BP 126/82  Pulse 79  Temp(Src) 98.2 F (36.8 C) (Oral)  Wt 192 lb (87.091 kg)  SpO2 98% Wt Readings from Last 3 Encounters:  10/03/13 192 lb (87.091 kg)  09/27/13 191 lb (86.637 kg)  09/27/13 191 lb (86.637 kg)    General: Appears his stated age, well developed, well nourished in NAD.  Cardiovascular: Normal rate and rhythm. S1,S2 noted.  No murmur, rubs or gallops noted. No JVD or BLE edema. No carotid bruits noted. Pulmonary/Chest: Normal effort and positive vesicular breath sounds. No respiratory distress. No wheezes, rales or ronchi noted.  Neurological: Alert and oriented. Cranial nerves II-XII intact. Coordination normal. +DTRs bilaterally.   BMET    Component Value Date/Time   NA 140 09/27/2013 1228   K 4.6 09/27/2013 1228   CL 103 09/27/2013 1228   CO2 31  09/27/2013 1228   GLUCOSE 99 09/27/2013 1228   BUN 12 09/27/2013 1228   CREATININE 0.84 09/27/2013 1228   CALCIUM 9.9 09/27/2013 1228    CBC    Component Value Date/Time   WBC 9.9 09/27/2013 1228   RBC 5.02 09/27/2013 1228   HGB 14.2 09/27/2013 1228   HCT 40.6 09/27/2013 1228   PLT 322 09/27/2013 1228   MCV 80.9 09/27/2013 1228   MCH 28.3 09/27/2013 1228   MCHC 35.0 09/27/2013 1228   RDW 13.6 09/27/2013 1228          Assessment & Plan:

## 2013-10-03 NOTE — Patient Instructions (Addendum)
Hypertension Hypertension, commonly called high blood pressure, is when the force of blood pumping through your arteries is too strong. Your arteries are the blood vessels that carry blood from your heart throughout your body. A blood pressure reading consists of a higher number over a lower number, such as 110/72. The higher number (systolic) is the pressure inside your arteries when your heart pumps. The lower number (diastolic) is the pressure inside your arteries when your heart relaxes. Ideally you want your blood pressure below 120/80. Hypertension forces your heart to work harder to pump blood. Your arteries may become narrow or stiff. Having hypertension puts you at risk for heart disease, stroke, and other problems.  RISK FACTORS Some risk factors for high blood pressure are controllable. Others are not.  Risk factors you cannot control include:   Race. You may be at higher risk if you are African American.  Age. Risk increases with age.  Gender. Men are at higher risk than women before age 45 years. After age 65, women are at higher risk than men. Risk factors you can control include:  Not getting enough exercise or physical activity.  Being overweight.  Getting too much fat, sugar, calories, or salt in your diet.  Drinking too much alcohol. SIGNS AND SYMPTOMS Hypertension does not usually cause signs or symptoms. Extremely high blood pressure (hypertensive crisis) may cause headache, anxiety, shortness of breath, and nosebleed. DIAGNOSIS  To check if you have hypertension, your health care provider will measure your blood pressure while you are seated, with your arm held at the level of your heart. It should be measured at least twice using the same arm. Certain conditions can cause a difference in blood pressure between your right and left arms. A blood pressure reading that is higher than normal on one occasion does not mean that you need treatment. If one blood pressure reading  is high, ask your health care provider about having it checked again. TREATMENT  Treating high blood pressure includes making lifestyle changes and possibly taking medication. Living a healthy lifestyle can help lower high blood pressure. You may need to change some of your habits. Lifestyle changes may include:  Following the DASH diet. This diet is high in fruits, vegetables, and whole grains. It is low in salt, red meat, and added sugars.  Getting at least 2 1/2 hours of brisk physical activity every week.  Losing weight if necessary.  Not smoking.  Limiting alcoholic beverages.  Learning ways to reduce stress. If lifestyle changes are not enough to get your blood pressure under control, your health care provider may prescribe medicine. You may need to take more than one. Work closely with your health care provider to understand the risks and benefits. HOME CARE INSTRUCTIONS  Have your blood pressure rechecked as directed by your health care provider.   Only take medicine as directed by your health care provider. Follow the directions carefully. Blood pressure medicines must be taken as prescribed. The medicine does not work as well when you skip doses. Skipping doses also puts you at risk for problems.   Do not smoke.   Monitor your blood pressure at home as directed by your health care provider. SEEK MEDICAL CARE IF:   You think you are having a reaction to medicines taken.  You have recurrent headaches or feel dizzy.  You have swelling in your ankles.  You have trouble with your vision. SEEK IMMEDIATE MEDICAL CARE IF:  You develop a severe headache or   confusion.  You have unusual weakness, numbness, or feel faint.  You have severe chest or abdominal pain.  You vomit repeatedly.  You have trouble breathing. MAKE SURE YOU:   Understand these instructions.  Will watch your condition.  Will get help right away if you are not doing well or get  worse. Document Released: 03/15/2005 Document Revised: 03/20/2013 Document Reviewed: 01/05/2013 Exit[employer] Patient Information 2015 [employer]. This information is not intended to replace advice given to you by your health care provider. Make sure you discuss any questions you have with your health care provider.  

## 2013-10-03 NOTE — Telephone Encounter (Signed)
Relevant patient education mailed to patient.  

## 2013-10-03 NOTE — Assessment & Plan Note (Addendum)
Well controlled on the HCTZ Medication refilled today Letter for surgical clearance will be faxed to orthopedic office

## 2013-10-16 ENCOUNTER — Encounter (HOSPITAL_BASED_OUTPATIENT_CLINIC_OR_DEPARTMENT_OTHER): Payer: Self-pay | Admitting: *Deleted

## 2013-10-16 NOTE — Pre-Procedure Instructions (Signed)
To come for BMET and EKG 

## 2013-10-16 NOTE — Progress Notes (Signed)
10/16/13 1416  OBSTRUCTIVE SLEEP APNEA  Have you ever been diagnosed with sleep apnea through a sleep study? No  Do you snore loudly (loud enough to be heard through closed doors)?  1  Do you often feel tired, fatigued, or sleepy during the daytime? 1  Has anyone observed you stop breathing during your sleep? 1  Do you have, or are you being treated for high blood pressure? 1  BMI more than 35 kg/m2? 0  Age over 40 years old? 0  Gender: 1  Obstructive Sleep Apnea Score 5  Score 4 or greater  Results sent to PCP Nicki Reaper(Regina Baity, NP)

## 2013-10-18 ENCOUNTER — Encounter (HOSPITAL_BASED_OUTPATIENT_CLINIC_OR_DEPARTMENT_OTHER)
Admission: RE | Admit: 2013-10-18 | Discharge: 2013-10-18 | Disposition: A | Discharge status: Home / Self Care | Payer: 59 | Source: Ambulatory Visit | Attending: Orthopaedic Surgery | Admitting: Orthopaedic Surgery

## 2013-10-18 DIAGNOSIS — M2240 Chondromalacia patellae, unspecified knee: Secondary | ICD-10-CM | POA: Diagnosis not present

## 2013-10-18 DIAGNOSIS — IMO0002 Reserved for concepts with insufficient information to code with codable children: Secondary | ICD-10-CM | POA: Diagnosis present

## 2013-10-18 DIAGNOSIS — F411 Generalized anxiety disorder: Secondary | ICD-10-CM | POA: Diagnosis not present

## 2013-10-18 DIAGNOSIS — Z01812 Encounter for preprocedural laboratory examination: Secondary | ICD-10-CM | POA: Insufficient documentation

## 2013-10-18 DIAGNOSIS — Z01810 Encounter for preprocedural cardiovascular examination: Secondary | ICD-10-CM | POA: Insufficient documentation

## 2013-10-18 DIAGNOSIS — X500XXA Overexertion from strenuous movement or load, initial encounter: Secondary | ICD-10-CM | POA: Diagnosis not present

## 2013-10-18 DIAGNOSIS — I10 Essential (primary) hypertension: Secondary | ICD-10-CM | POA: Diagnosis not present

## 2013-10-18 DIAGNOSIS — G8929 Other chronic pain: Secondary | ICD-10-CM | POA: Diagnosis not present

## 2013-10-18 DIAGNOSIS — M549 Dorsalgia, unspecified: Secondary | ICD-10-CM | POA: Diagnosis not present

## 2013-10-18 DIAGNOSIS — J45909 Unspecified asthma, uncomplicated: Secondary | ICD-10-CM | POA: Diagnosis not present

## 2013-10-18 LAB — BASIC METABOLIC PANEL
ANION GAP: 11 (ref 5–15)
BUN: 18 mg/dL (ref 6–23)
CO2: 30 meq/L (ref 19–32)
Calcium: 9.5 mg/dL (ref 8.4–10.5)
Chloride: 99 mEq/L (ref 96–112)
Creatinine, Ser: 0.83 mg/dL (ref 0.50–1.35)
GFR calc Af Amer: >90 mL/min (ref >90)
Glucose, Bld: 118 mg/dL — ABNORMAL HIGH (ref 70–99)
Potassium: 3.9 mEq/L (ref 3.7–5.3)
SODIUM: 140 meq/L (ref 137–147)

## 2013-10-18 NOTE — Progress Notes (Signed)
bp taken in each arm.  Pt admits to increased anxiety anytime dealing with anything medical.  Has been taking med states last few days running high 90's diastolic.  Advised pt to continue med avoid high sodium food and to come in early around 1215 day of surgery for bp check.

## 2013-10-19 NOTE — H&P (Signed)
Steven Murillo is an 38 y.o. male.   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 He is here in referral through his doctor about his right knee. About 4 months ago he twisted it while helping his brother. He has experienced occasional locking episodes since that time. He has a bit of a catch in the knee. He wears a brace and limits his motion so it will not lock on him. The pain is severe at this point. It has a sharp quality. It is getting worse. This does wake him from sleep. He has been managed on oral anti-inflammatories and is currently on Percocet as a pain pill as well. He's had an MRI scan with which he presents. He has no history of this type of trouble.  Imaging/Tests: I reviewed an MRI scan films and report of a study done at [facility] in the [facility] on 07/06/13. This shows a bucket-handle tear of the medial meniscus with a large portion of this structure flipped into the notch. The ACL looks to be intact and he has minimal degenerative change.  Past Medical History  Diagnosis Date  . Anxiety   . Chronic back pain 06/12/2013  . Asthma     daily and prn inhalers  . Hypertension     started on BP med. first week of July 2015  . Medial meniscus tear 09/2013    right knee  . 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 Tooth extraction    . Vasectomy    . Back surgery  1991    lower back    Family History  Problem Relation Age of Onset  . Sleep apnea Father   . Diabetes Father   . Heart disease Father   . Alcohol abuse Brother   . Alcohol abuse Paternal Grandfather    Social History:  reports that he has never smoked. He has never used smokeless tobacco. He reports that he does not drink alcohol or use illicit drugs.  Allergies: No Known Allergies  No prescriptions prior to admission    Results for orders placed during the hospital encounter of 10/23/13 (from  the past 48 hour(s))  BASIC METABOLIC PANEL     Status: Abnormal   Collection Time    10/18/13  9:35 AM      Result Value Ref Range   Sodium 140  137 - 147 mEq/L   Potassium 3.9  3.7 - 5.3 mEq/L   Chloride 99  96 - 112 mEq/L   CO2 30  19 - 32 mEq/L   Glucose, Bld 118 (*) 70 - 99 mg/dL   BUN 18  6 - 23 mg/dL   Creatinine, Ser 0.83  0.50 - 1.35 mg/dL   Calcium 9.5  8.4 - 10.5 mg/dL   GFR calc non Af Amer >90  >90 mL/min   GFR calc Af Amer >90  >90 mL/min   Comment: (NOTE)     The eGFR has been calculated using the CKD EPI equation.     This calculation has not been validated in all clinical situations.     eGFR's persistently <90 mL/min signify possible Chronic Kidney     Disease.   Anion gap 11  5 - 15   No results found.  Review of Systems  Musculoskeletal:       Right knee pain  All other systems reviewed and  are negative.   Blood pressure 154/112, height 5' 8"  (1.727 m), weight 88.451 kg (195 lb). Physical Exam  Constitutional: He is oriented to person, place, and time. He appears well-developed and well-nourished.  HENT:  Head: Normocephalic and atraumatic.  Eyes: Conjunctivae are normal. Pupils are equal, round, and reactive to light.  Neck: Normal range of motion.  Cardiovascular: Normal rate and regular rhythm.   Respiratory: Effort normal.  GI: Soft.  Musculoskeletal:  Right knee has trace effusion. He has no scars. His motion is 0-130 at which point he has some apprehension. He has good ligamentous integrity. There is no crepitation. He has medial greater than lateral joint line pain. Hip motion is full and pain free and SLR is negative on both sides.  There is no palpable LAD behind either knee. Sensation and motor function are intact on both sides and there are palpable pulses on both sides.    Neurological: He is alert and oriented to person, place, and time.  Skin: Skin is warm and dry.  Psychiatric: He has a normal mood and affect. His behavior is normal.  Judgment and thought content normal.     Assessment/Plan Assessment:   Right knee torn medial meniscus by MRI  Plan: Steven Murillo has a surgical problem at his knee. He has a displaceable medial meniscus tear which causes his knee to lock occasionally. He has been symptomatic for several months despite various pills and the use of a brace. This limits his ability to rest and walk and work and I've offered him a knee arthroscopy. I reviewed risks of anesthesia and infection as well as potential for DVT related to a knee arthroscopy.  I've stressed the importance of some postoperative physical therapy to optimize results and we will try to set up an appointment.  Two to four  weeks for recovery would be typical but that is a little variable.   Anaira Seay, Larwance Sachs 10/19/2013, 3:45 PM

## 2013-10-23 ENCOUNTER — Ambulatory Visit (HOSPITAL_BASED_OUTPATIENT_CLINIC_OR_DEPARTMENT_OTHER)
Admission: RE | Admit: 2013-10-23 | Discharge: 2013-10-23 | Disposition: A | Discharge status: Home / Self Care | Payer: 59 | Source: Ambulatory Visit | Attending: Orthopaedic Surgery | Admitting: Orthopaedic Surgery

## 2013-10-23 ENCOUNTER — Telehealth: Payer: Self-pay

## 2013-10-23 ENCOUNTER — Encounter (HOSPITAL_BASED_OUTPATIENT_CLINIC_OR_DEPARTMENT_OTHER): Payer: 59 | Admitting: Anesthesiology

## 2013-10-23 ENCOUNTER — Encounter (HOSPITAL_BASED_OUTPATIENT_CLINIC_OR_DEPARTMENT_OTHER)
Admission: RE | Disposition: A | Discharge status: Home / Self Care | Payer: Self-pay | Source: Ambulatory Visit | Attending: Orthopaedic Surgery

## 2013-10-23 ENCOUNTER — Encounter (HOSPITAL_BASED_OUTPATIENT_CLINIC_OR_DEPARTMENT_OTHER): Payer: Self-pay | Admitting: Anesthesiology

## 2013-10-23 ENCOUNTER — Ambulatory Visit (HOSPITAL_BASED_OUTPATIENT_CLINIC_OR_DEPARTMENT_OTHER): Payer: 59 | Admitting: Anesthesiology

## 2013-10-23 DIAGNOSIS — F411 Generalized anxiety disorder: Secondary | ICD-10-CM | POA: Insufficient documentation

## 2013-10-23 DIAGNOSIS — G8929 Other chronic pain: Secondary | ICD-10-CM | POA: Insufficient documentation

## 2013-10-23 DIAGNOSIS — IMO0002 Reserved for concepts with insufficient information to code with codable children: Secondary | ICD-10-CM | POA: Insufficient documentation

## 2013-10-23 DIAGNOSIS — Z01812 Encounter for preprocedural laboratory examination: Secondary | ICD-10-CM | POA: Insufficient documentation

## 2013-10-23 DIAGNOSIS — M25561 Pain in right knee: Secondary | ICD-10-CM

## 2013-10-23 DIAGNOSIS — J45909 Unspecified asthma, uncomplicated: Secondary | ICD-10-CM | POA: Insufficient documentation

## 2013-10-23 DIAGNOSIS — M549 Dorsalgia, unspecified: Secondary | ICD-10-CM | POA: Insufficient documentation

## 2013-10-23 DIAGNOSIS — M2240 Chondromalacia patellae, unspecified knee: Secondary | ICD-10-CM | POA: Insufficient documentation

## 2013-10-23 DIAGNOSIS — I10 Essential (primary) hypertension: Secondary | ICD-10-CM | POA: Insufficient documentation

## 2013-10-23 DIAGNOSIS — X500XXA Overexertion from strenuous movement or load, initial encounter: Secondary | ICD-10-CM | POA: Insufficient documentation

## 2013-10-23 HISTORY — PX: KNEE ARTHROSCOPY: SHX127

## 2013-10-23 HISTORY — DX: Essential (primary) hypertension: I10

## 2013-10-23 HISTORY — DX: Other tear of medial meniscus, current injury, unspecified knee, initial encounter: S83.249A

## 2013-10-23 HISTORY — DX: Chondromalacia, right knee: M94.261

## 2013-10-23 SURGERY — ARTHROSCOPY, KNEE
Anesthesia: Choice | Site: Knee | Laterality: Right

## 2013-10-23 MED ORDER — OXYCODONE-ACETAMINOPHEN 5-325 MG PO TABS: 1.0000 | ORAL_TABLET | Freq: Four times a day (QID) | ORAL | Status: DC | PRN

## 2013-10-23 MED ORDER — BUPIVACAINE-EPINEPHRINE (PF) 0.5% -1:200000 IJ SOLN
INTRAMUSCULAR | Status: AC
  Filled 2013-10-23: qty 30

## 2013-10-23 MED ORDER — LACTATED RINGERS IV SOLN
INTRAVENOUS | Status: DC
  Administered 2013-10-23: 13:00:00 via INTRAVENOUS

## 2013-10-23 MED ORDER — FENTANYL CITRATE 0.05 MG/ML IJ SOLN
INTRAMUSCULAR | Status: AC
  Filled 2013-10-23: qty 4

## 2013-10-23 MED ORDER — MIDAZOLAM HCL 5 MG/5ML IJ SOLN
INTRAMUSCULAR | Status: DC | PRN
  Administered 2013-10-23: 2 mg via INTRAVENOUS

## 2013-10-23 MED ORDER — MIDAZOLAM HCL 2 MG/2ML IJ SOLN
INTRAMUSCULAR | Status: AC
  Filled 2013-10-23: qty 2

## 2013-10-23 MED ORDER — SODIUM CHLORIDE 0.9 % IR SOLN
Status: DC | PRN
  Administered 2013-10-23: 6000 mL

## 2013-10-23 MED ORDER — CEFAZOLIN SODIUM-DEXTROSE 2-3 GM-% IV SOLR
2.0000 g | INTRAVENOUS | Status: AC
  Administered 2013-10-23: 2 g via INTRAVENOUS

## 2013-10-23 MED ORDER — ONDANSETRON HCL 4 MG/2ML IJ SOLN
INTRAMUSCULAR | Status: DC | PRN
  Administered 2013-10-23: 4 mg via INTRAVENOUS

## 2013-10-23 MED ORDER — MORPHINE SULFATE 4 MG/ML IJ SOLN
INTRAMUSCULAR | Status: DC | PRN
  Administered 2013-10-23: 1 mg via INTRAVENOUS

## 2013-10-23 MED ORDER — DEXAMETHASONE SODIUM PHOSPHATE 4 MG/ML IJ SOLN
INTRAMUSCULAR | Status: DC | PRN
  Administered 2013-10-23: 10 mg via INTRAVENOUS

## 2013-10-23 MED ORDER — FENTANYL CITRATE 0.05 MG/ML IJ SOLN
INTRAMUSCULAR | Status: DC | PRN
  Administered 2013-10-23: 100 ug via INTRAVENOUS
  Administered 2013-10-23: 25 ug via INTRAVENOUS
  Administered 2013-10-23 (×2): 50 ug via INTRAVENOUS

## 2013-10-23 MED ORDER — LACTATED RINGERS IV SOLN
INTRAVENOUS | Status: DC
  Administered 2013-10-23: 15:00:00 via INTRAVENOUS

## 2013-10-23 MED ORDER — FENTANYL CITRATE 0.05 MG/ML IJ SOLN: 50.0000 ug | INTRAMUSCULAR | Status: DC | PRN

## 2013-10-23 MED ORDER — METHYLPREDNISOLONE ACETATE 40 MG/ML IJ SUSP
INTRAMUSCULAR | Status: AC
  Filled 2013-10-23: qty 1

## 2013-10-23 MED ORDER — LABETALOL HCL 5 MG/ML IV SOLN
INTRAVENOUS | Status: AC
  Filled 2013-10-23: qty 4

## 2013-10-23 MED ORDER — ONDANSETRON HCL 4 MG/2ML IJ SOLN: 4.0000 mg | Freq: Once | INTRAMUSCULAR | Status: DC | PRN

## 2013-10-23 MED ORDER — PROPOFOL 10 MG/ML IV BOLUS
INTRAVENOUS | Status: DC | PRN
  Administered 2013-10-23: 100 mg via INTRAVENOUS
  Administered 2013-10-23: 250 mg via INTRAVENOUS

## 2013-10-23 MED ORDER — HYDRALAZINE HCL 20 MG/ML IJ SOLN
INTRAMUSCULAR | Status: AC
  Filled 2013-10-23: qty 1

## 2013-10-23 MED ORDER — HYDROMORPHONE HCL PF 1 MG/ML IJ SOLN
INTRAMUSCULAR | Status: AC
  Filled 2013-10-23: qty 1

## 2013-10-23 MED ORDER — METHOCARBAMOL 500 MG PO TABS: 500.0000 mg | ORAL_TABLET | Freq: Four times a day (QID) | ORAL | Status: DC | PRN

## 2013-10-23 MED ORDER — MIDAZOLAM HCL 2 MG/2ML IJ SOLN: 1.0000 mg | INTRAMUSCULAR | Status: DC | PRN

## 2013-10-23 MED ORDER — MORPHINE SULFATE 4 MG/ML IJ SOLN
INTRAMUSCULAR | Status: AC
  Filled 2013-10-23: qty 1

## 2013-10-23 MED ORDER — HYDROMORPHONE HCL PF 1 MG/ML IJ SOLN
0.2500 mg | INTRAMUSCULAR | Status: DC | PRN
  Administered 2013-10-23 (×2): 0.5 mg via INTRAVENOUS

## 2013-10-23 MED ORDER — HYDRALAZINE HCL 20 MG/ML IJ SOLN
5.0000 mg | Freq: Once | INTRAMUSCULAR | Status: AC | PRN
  Administered 2013-10-23: 5 mg via INTRAVENOUS

## 2013-10-23 MED ORDER — BUPIVACAINE-EPINEPHRINE 0.5% -1:200000 IJ SOLN
INTRAMUSCULAR | Status: DC | PRN
  Administered 2013-10-23: 10 mL

## 2013-10-23 MED ORDER — LABETALOL HCL 5 MG/ML IV SOLN
5.0000 mg | INTRAVENOUS | Status: DC | PRN
  Administered 2013-10-23 (×3): 5 mg via INTRAVENOUS

## 2013-10-23 MED ORDER — LIDOCAINE HCL (CARDIAC) 20 MG/ML IV SOLN
INTRAVENOUS | Status: DC | PRN
  Administered 2013-10-23: 60 mg via INTRAVENOUS

## 2013-10-23 MED ORDER — PROPOFOL 10 MG/ML IV BOLUS
INTRAVENOUS | Status: AC
  Filled 2013-10-23: qty 20

## 2013-10-23 MED ORDER — METHYLPREDNISOLONE ACETATE 80 MG/ML IJ SUSP
INTRAMUSCULAR | Status: AC
  Filled 2013-10-23: qty 1

## 2013-10-23 MED ORDER — OXYCODONE HCL 5 MG/5ML PO SOLN: 5.0000 mg | Freq: Once | ORAL | Status: DC | PRN

## 2013-10-23 MED ORDER — CEFAZOLIN SODIUM-DEXTROSE 2-3 GM-% IV SOLR
INTRAVENOUS | Status: AC
  Filled 2013-10-23: qty 50

## 2013-10-23 MED ORDER — CHLORHEXIDINE GLUCONATE 4 % EX LIQD: 60.0000 mL | Freq: Once | CUTANEOUS | Status: DC

## 2013-10-23 MED ORDER — FENTANYL CITRATE 0.05 MG/ML IJ SOLN
INTRAMUSCULAR | Status: AC
  Filled 2013-10-23: qty 6

## 2013-10-23 MED ORDER — OXYCODONE HCL 5 MG PO TABS: 5.0000 mg | ORAL_TABLET | Freq: Once | ORAL | Status: DC | PRN

## 2013-10-23 SURGICAL SUPPLY — 45 items
BANDAGE ELASTIC 6 VELCRO ST LF (GAUZE/BANDAGES/DRESSINGS) ×3 IMPLANT
BLADE CUDA 5.5 (BLADE) IMPLANT
BLADE CUTTER GATOR 3.5 (BLADE) ×2 IMPLANT
BLADE GREAT WHITE 4.2 (BLADE) ×2 IMPLANT
BLADE GREAT WHITE 4.2MM (BLADE) ×1
BNDG GAUZE ELAST 4 BULKY (GAUZE/BANDAGES/DRESSINGS) ×3 IMPLANT
CANISTER SUCT 3000ML (MISCELLANEOUS) IMPLANT
DRAPE ARTHROSCOPY W/POUCH 114 (DRAPES) ×3 IMPLANT
DRAPE U-SHAPE 47X51 STRL (DRAPES) ×3 IMPLANT
DRSG EMULSION OIL 3X3 NADH (GAUZE/BANDAGES/DRESSINGS) ×3 IMPLANT
DURAPREP 26ML APPLICATOR (WOUND CARE) ×3 IMPLANT
ELECT MENISCUS 165MM 90D (ELECTRODE) IMPLANT
ELECT REM PT RETURN 9FT ADLT (ELECTROSURGICAL)
ELECTRODE REM PT RTRN 9FT ADLT (ELECTROSURGICAL) IMPLANT
GAUZE SPONGE 4X4 12PLY STRL (GAUZE/BANDAGES/DRESSINGS) ×3 IMPLANT
GLOVE BIO SURGEON STRL SZ 6.5 (GLOVE) ×1 IMPLANT
GLOVE BIO SURGEON STRL SZ8 (GLOVE) ×6 IMPLANT
GLOVE BIO SURGEON STRL SZ8.5 (GLOVE) ×1 IMPLANT
GLOVE BIO SURGEONS STRL SZ 6.5 (GLOVE) ×1
GLOVE BIOGEL PI IND STRL 7.0 (GLOVE) IMPLANT
GLOVE BIOGEL PI IND STRL 8 (GLOVE) ×2 IMPLANT
GLOVE BIOGEL PI IND STRL 8.5 (GLOVE) IMPLANT
GLOVE BIOGEL PI INDICATOR 7.0 (GLOVE) ×2
GLOVE BIOGEL PI INDICATOR 8 (GLOVE) ×4
GLOVE BIOGEL PI INDICATOR 8.5 (GLOVE)
GLOVE SURG SS PI 8.0 STRL IVOR (GLOVE) ×1 IMPLANT
GOWN STRL REUS W/ TWL LRG LVL3 (GOWN DISPOSABLE) ×1 IMPLANT
GOWN STRL REUS W/ TWL XL LVL3 (GOWN DISPOSABLE) ×2 IMPLANT
GOWN STRL REUS W/TWL LRG LVL3 (GOWN DISPOSABLE) ×3
GOWN STRL REUS W/TWL XL LVL3 (GOWN DISPOSABLE) ×6
IV NS IRRIG 3000ML ARTHROMATIC (IV SOLUTION) IMPLANT
KNEE WRAP E Z 3 GEL PACK (MISCELLANEOUS) ×3 IMPLANT
MANIFOLD NEPTUNE II (INSTRUMENTS) IMPLANT
PACK ARTHROSCOPY DSU (CUSTOM PROCEDURE TRAY) ×3 IMPLANT
PACK BASIN DAY SURGERY FS (CUSTOM PROCEDURE TRAY) ×3 IMPLANT
PENCIL BUTTON HOLSTER BLD 10FT (ELECTRODE) IMPLANT
SET ARTHROSCOPY TUBING (MISCELLANEOUS) ×3
SET ARTHROSCOPY TUBING LN (MISCELLANEOUS) ×1 IMPLANT
SHEET MEDIUM DRAPE 40X70 STRL (DRAPES) ×3 IMPLANT
SYRINGE 3CC/18X1.5 ECLIPSE (MISCELLANEOUS) IMPLANT
TOWEL OR 17X24 6PK STRL BLUE (TOWEL DISPOSABLE) ×3 IMPLANT
TOWEL OR NON WOVEN STRL DISP B (DISPOSABLE) ×3 IMPLANT
WAND 30 DEG SABER W/CORD (SURGICAL WAND) IMPLANT
WAND STAR VAC 90 (SURGICAL WAND) IMPLANT
WATER STERILE IRR 1000ML POUR (IV SOLUTION) ×3 IMPLANT

## 2013-10-23 NOTE — Anesthesia Preprocedure Evaluation (Signed)
Anesthesia Evaluation  Patient identified by MRN, date of birth, ID band Patient awake    Reviewed: Allergy & Precautions, H&P , NPO status , Patient's Chart, lab work & pertinent test results  Airway Mallampati: I TM Distance: >3 FB Neck ROM: Full    Dental  (+) Teeth Intact, Dental Advisory Given   Pulmonary  breath sounds clear to auscultation        Cardiovascular hypertension, Pt. on medications Rhythm:Regular Rate:Normal     Neuro/Psych    GI/Hepatic   Endo/Other    Renal/GU      Musculoskeletal   Abdominal   Peds  Hematology   Anesthesia Other Findings Pt states he has taken his HCTZ every day without fail for the past two weeks since being cancelled for his knee surgery. The BP returns to normal by both he and his wife's hx when he is not in a medical facility.  Reproductive/Obstetrics                           Anesthesia Physical Anesthesia Plan  ASA: II  Anesthesia Plan:    Post-op Pain Management:    Induction: Intravenous  Airway Management Planned: LMA  Additional Equipment:   Intra-op Plan:   Post-operative Plan: Extubation in OR  Informed Consent: I have reviewed the patients History and Physical, chart, labs and discussed the procedure including the risks, benefits and alternatives for the proposed anesthesia with the patient or authorized representative who has indicated his/her understanding and acceptance.   Dental advisory given  Plan Discussed with: CRNA, Anesthesiologist and Surgeon  Anesthesia Plan Comments:         Anesthesia Quick Evaluation

## 2013-10-23 NOTE — Op Note (Signed)
#  [id number] 

## 2013-10-23 NOTE — Discharge Instructions (Signed)
° ° ° ° ° ° °Arthroscopic Knee Surgery Post -OP Instructions ° °You have just had an arthroscopic operation. Your incisions (puncture sites) are small and should heal quickly. The structures inside your knee may take 6-8 weeks to heal and settle down. This healing time is variable and may range from a few days to 6-8 weeks. ° °Pain Medication: °You will be given a prescription for pain medication. Please take the medication as needed. Most patients require pain medication for only a few days. ° °Swelling: °You can expect some swelling in your knee. Applying ice and elevating your leg will help keep the swelling to a minimum. Ice can be applied by placing ice cubes in a plastic bag and putting the bag on your knee with a towel between the ice bag and your knee. Sometimes you will be issued an ice pack wrap and that will work as well. Your entire leg should be elevated, not just your knee. Elevate your leg to or above the level of your heart. The ice should be used periodically during the first 48 hours along with continued elevation of your leg. The swelling should subside over the next several weeks. ° °Dressing: °Fluid leakage is common the first night. Precautions to prevent staining of your clothes, bed sheets, etc., should be taken. This fluid was used to inflate the joint during surgery and is commonly tinged red from a small amount of blood. Remove your dressing tomorrow. Some bleeding or leakage from the puncture sites may occur for a few days and you should cover the puncture sites with Band-Aids until the leakage stops.Alternatively you may reapply the ace wrap with some gauze pads over the puncture sites but don't wrap it too tightly.  You may shower 2 days after surgery.  ° °Activity: °You may bend and straighten your knee as soon as it is comfortable to do so. Using your knee will help decrease swelling and help prevent stiffness, as long as you don’t overdo it. Moving your foot up and down also helps  decrease the swelling. There is no harm in putting weight on; your leg as long as it is comfortable to do so. (You should not, try to run or jump). Gradually increase activity as you can tolerate. An increase in pain or swelling with certain activities or with increased activities may indicate you are doing too much. Back up and start building up your activities at a slower rate. You may need crutches for a few days. ° °Office Check-Up: °If no major problems arise and you are progressing well, we will need to see you in the office in one (1) week unless otherwise instructed by your doctor. Please call the office to make an appointment.  ° ° ° °Post Anesthesia Home Care Instructions ° °Activity: °Get plenty of rest for the remainder of the day. A responsible adult should stay with you for 24 hours following the procedure.  °For the next 24 hours, DO NOT: °-Drive a car °-Operate machinery °-Drink alcoholic beverages °-Take any medication unless instructed by your physician °-Make any legal decisions or sign important papers. ° °Meals: °Start with liquid foods such as gelatin or soup. Progress to regular foods as tolerated. Avoid greasy, spicy, heavy foods. If nausea and/or vomiting occur, drink only clear liquids until the nausea and/or vomiting subsides. Call your physician if vomiting continues. ° °Special Instructions/Symptoms: °Your throat may feel dry or sore from the anesthesia or the breathing tube placed in your throat during surgery.   If this causes discomfort, gargle with warm salt water. The discomfort should disappear within 24 hours. ° °

## 2013-10-23 NOTE — Anesthesia Procedure Notes (Signed)
Procedure Name: LMA Insertion Date/Time: 10/23/2013 3:03 PM Performed by: Gar GibbonKEETON, Kathia Covington S Pre-anesthesia Checklist: Patient identified, Emergency Drugs available, Suction available and Patient being monitored Patient Re-evaluated:Patient Re-evaluated prior to inductionOxygen Delivery Method: Circle System Utilized Preoxygenation: Pre-oxygenation with 100% oxygen Intubation Type: IV induction Ventilation: Mask ventilation without difficulty LMA: LMA inserted LMA Size: 4.0 Number of attempts: 2 Airway Equipment and Method: bite block Placement Confirmation: positive ETCO2 Tube secured with: Tape Dental Injury: Teeth and Oropharynx as per pre-operative assessment

## 2013-10-23 NOTE — Interval H&P Note (Signed)
History and Physical Interval Note:  10/23/2013 2:35 PM  Steven Murillo  has presented today for surgery, with the diagnosis of RIGHT KNEE MEDIAL MENISCUS TEAR CHONDROMALACIA   The various methods of treatment have been discussed with the patient and family. After consideration of risks, benefits and other options for treatment, the patient has consented to  Procedure(s): RIGHT KNEE ARTHROSCOPY (Right) as a surgical intervention .  The patient's history has been reviewed, patient examined, no change in status, stable for surgery.  I have reviewed the patient's chart and labs.  Questions were answered to the patient's satisfaction.     [name]

## 2013-10-23 NOTE — Telephone Encounter (Signed)
Mrs Dulski left v/m; pt was recently seen; pt had knee surgery today and pt was told pts present med is not lowering BP. Wants to know if could change BP med to Lopressor; pt took Lopressor years ago and it worked well. Pt was advised cannot have back surgery until gets BP under control. Mrs Siciliano request [facility].

## 2013-10-23 NOTE — Transfer of Care (Signed)
Immediate Anesthesia Transfer of Care Note  Patient: Steven Murillo  Procedure(s) Performed: Procedure(s): RIGHT KNEE ARTHROSCOPY WITH MEDIAL MENISECTOMY AND CHONDROPLASTY (Right)  Patient Location: PACU  Anesthesia Type:General  Level of Consciousness: awake and patient cooperative  Airway & Oxygen Therapy: Patient Spontanous Breathing and Patient connected to face mask oxygen  Post-op Assessment: Report given to PACU RN and Post -op Vital signs reviewed and stable  Post vital signs: Reviewed and stable  Complications: No apparent anesthesia complications

## 2013-10-24 ENCOUNTER — Telehealth: Payer: Self-pay | Admitting: *Deleted

## 2013-10-24 ENCOUNTER — Institutional Professional Consult (permissible substitution): Payer: 59 | Admitting: Pulmonary Disease

## 2013-10-24 ENCOUNTER — Encounter (HOSPITAL_BASED_OUTPATIENT_CLINIC_OR_DEPARTMENT_OTHER): Payer: Self-pay | Admitting: Orthopaedic Surgery

## 2013-10-24 NOTE — Op Note (Signed)
NAMAlice Rieger:  Alcorn, [place]           ACCOUNT NO.:  [id number]  MEDICAL RECORD NO.:  [id number]  LOCATION:                                 FACILITY:  PHYSICIAN:  Lubertha Basqueeter G. Derian Dimalanta, M.D.DATE OF BIRTH:  03-02-1976  DATE OF PROCEDURE:  10/23/2013 DATE OF DISCHARGE:  10/23/2013                              OPERATIVE REPORT   PREOPERATIVE DIAGNOSES: 1. Right knee torn medial meniscus. 2. Right knee chondromalacia.  POSTOPERATIVE DIAGNOSES: 1. Right knee torn medial meniscus. 2. Right knee chondromalacia.  PROCEDURE: 1. Right knee partial meniscectomy. 2. Right knee abrasion chondroplasty patellofemoral.  ANESTHESIA:  General.  ATTENDING SURGEON:  Lubertha BasquePeter G. Jerl Santosalldorf, MD  ASSISTANT:  Elodia FlorenceAndrew Nida, PA  INDICATION FOR PROCEDURE:  The patient is a 38 year old man with about 5 months of locking of his right knee.  This has persisted despite various conservative measures.  This has become quite disabling for him.  He underwent an MRI scan, which showed a bucket-handle tear of the medial meniscus which was displaced into the notch.  He has offered arthroscopy.  Informed operative consent was obtained after discussion of possible complications including reaction to anesthesia and infection.  SUMMARY OF FINDINGS AND PROCEDURE:  Under general anesthesia, an arthroscopy of the right knee was performed.  Suprapatellar pouch was benign while the patellofemoral joint exhibited some focal breakdown at the apex of the patella.  Chondroplasty was done along with abrasion of bleeding bone in a tiny area.  The medial meniscus was addressed with a partial medial meniscectomy using basket and shaver.  About 25% of the medial meniscus was removed.  There were no degenerative changes, medial or lateral.  The ACL was normal.  He was discharged home the same day.  DESCRIPTION OF PROCEDURE:  The patient was taken to the operating suite where general anesthetic was applied without difficulty.  He  was positioned supine and prepped and draped in normal sterile fashion. After the administration of IV Kefzol and an appropriate time-out, an arthroscopy of the right knee was performed through a total of 2 portals.  Findings were as noted above and procedure consisted of the abrasion chondroplasty along with the partial medial meniscectomy.  The knee was thoroughly irrigated followed by placement of Marcaine with epinephrine and morphine.  Adaptic was placed over the portals followed by dry gauze and a loose Ace wrap.  Estimated blood loss and intraoperative fluids can be obtained from anesthesia records.  DISPOSITION:  The patient was extubated in the operating room and taken to recovery room in stable condition.  Plans were for him to go home same-day and follow up in the office in less than a week.  I will contact him by phone tonight.     Lubertha BasquePeter G. Jerl Santosalldorf, M.D.     PGD/MEDQ  D:  10/23/2013  T:  10/24/2013  Job:  [id number]

## 2013-10-24 NOTE — Telephone Encounter (Signed)
Patient's wife called and he had knee surgery yesterday. While there he was noted to have high BP. He is scheduled for back surgery next month. The staff said there was no way they would be able to do the back surgery if his BP wasn't under control. He was on medication for this in the past. Would you be willing to send in a medication for him without an OV or do you need to see him in the office first? He is wanting something soon, so he doesn't have to reschedule his upcoming surgery. His wife can be reached at [phone] [facility].

## 2013-10-24 NOTE — Telephone Encounter (Signed)
We have already taken care of this, seen previous notes in chart

## 2013-10-24 NOTE — Telephone Encounter (Signed)
20-12.5, 2 refills

## 2013-10-24 NOTE — Telephone Encounter (Signed)
Lopressor is not the first line therapy for HTN. I reviewed his BP's, he only had 1 elevated BP in preop, his post op BP was fine. My BP would be elevated too if I was about to have surgery. I am ok with adding Lisinopril to his current med. We can change his current med to lisinopril-HCT if he would like.

## 2013-10-24 NOTE — Telephone Encounter (Signed)
Spoke to pt and he states he is okay with trying the Lisinopril HCTZ--what dose did you want to send in? Any refills?--please advise

## 2013-10-24 NOTE — Anesthesia Postprocedure Evaluation (Signed)
  Anesthesia Post-op Note  Patient: Steven Murillo  Procedure(s) Performed: Procedure(s): RIGHT KNEE ARTHROSCOPY WITH MEDIAL MENISECTOMY AND CHONDROPLASTY (Right)  Patient Location: PACU  Anesthesia Type:General  Level of Consciousness: awake and alert   Airway and Oxygen Therapy: Patient Spontanous Breathing  Post-op Pain: mild  Post-op Assessment: Post-op Vital signs reviewed, Patient's Cardiovascular Status Stable and Respiratory Function Stable  Post-op Vital Signs: Reviewed  Filed Vitals:   10/23/13 1740  BP: 135/99  Pulse: 78  Temp: 37.3 C  Resp: 14    Complications: No apparent anesthesia complications

## 2013-10-25 MED ORDER — LISINOPRIL-HYDROCHLOROTHIAZIDE 20-12.5 MG PO TABS: 1.0000 | ORAL_TABLET | Freq: Every day | ORAL | Status: DC

## 2013-10-25 NOTE — Addendum Note (Signed)
Addended by: Roena MaladyEVONTENNO, Tallia Moehring Y on: 10/25/2013 11:41 AM   Modules accepted: Orders

## 2013-10-25 NOTE — Telephone Encounter (Signed)
Rx has been sent to pharmacy

## 2013-11-02 ENCOUNTER — Telehealth: Payer: Self-pay | Admitting: *Deleted

## 2013-11-02 NOTE — Telephone Encounter (Signed)
Received prior auth request for celebrex. Forms placed in your inbox.

## 2013-11-06 ENCOUNTER — Institutional Professional Consult (permissible substitution): Payer: 59 | Admitting: Pulmonary Disease

## 2013-11-07 ENCOUNTER — Other Ambulatory Visit: Payer: Self-pay | Admitting: Neurosurgery

## 2013-11-08 ENCOUNTER — Encounter: Payer: Self-pay | Admitting: Pulmonary Disease

## 2013-11-08 ENCOUNTER — Ambulatory Visit (INDEPENDENT_AMBULATORY_CARE_PROVIDER_SITE_OTHER): Payer: 59 | Admitting: Pulmonary Disease

## 2013-11-08 VITALS — BP 114/62 | HR 80 | Temp 97.3°F | Wt 189.0 lb

## 2013-11-08 DIAGNOSIS — J4530 Mild persistent asthma, uncomplicated: Secondary | ICD-10-CM

## 2013-11-08 DIAGNOSIS — G4733 Obstructive sleep apnea (adult) (pediatric): Secondary | ICD-10-CM | POA: Insufficient documentation

## 2013-11-08 DIAGNOSIS — J45909 Unspecified asthma, uncomplicated: Secondary | ICD-10-CM

## 2013-11-08 NOTE — Assessment & Plan Note (Signed)
ct advair He is considering changing his job

## 2013-11-08 NOTE — Assessment & Plan Note (Signed)
Given excessive daytime somnolence, narrow pharyngeal exam, witnessed apneas & loud snoring, obstructive sleep apnea is very likely & an overnight polysomnogram will be scheduled as a split study. The pathophysiology of obstructive sleep apnea , it's cardiovascular consequences & modes of treatment including CPAP were discused with the patient in detail & they evidenced understanding. Since no comorbidities , home study OK

## 2013-11-08 NOTE — Patient Instructions (Signed)
Schedule home sleep study.   

## 2013-11-08 NOTE — Progress Notes (Signed)
Subjective:    Patient ID: Steven Murillo, male    DOB: 11/04/1975, 38 y.o.   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 He is scheduled for back surgery with Dr. Gerlene FeeKritzer on 12/04/13. His wife reports loud snoring and excessive daytime fatigue. Epworth sleepiness score is 0, but I suspect it report in this. She reports symptoms were worse when he was taking Percocet tablets 3-4 per day. He has stopped this about a week ago, and after 2 days of being unable to sleep, seems to be back to his usual sleep rhythm. She has noticed decrease in his snoring, and has not witnessed any apneas lately like she used to.Symptoms would also be worse on his back, or if he had a drink. Bedtime is around 9 PM , sleep latency is minimal, he sleeps on his back or side does not have a preferred position with 2 pillows, reports 2-3 awakenings, denies nocturia, and is out of bed by 7 AM and were day and 9 AM on weekends feeling tired with dryness of mouth but denies headache. There is no history suggestive of cataplexy, sleep paralysis or parasomnias. His father also has OSA and uses a CPAP.  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  He does admit to exposure to paint thinners and solvents containing isocyanate. Symptoms are controlled on Advair, and he is aware use of saba.  Past Medical History  Diagnosis Date  . Anxiety   . Chronic back pain 06/12/2013  . Asthma     daily and prn inhalers  . Hypertension     started on BP med. first week of July 2015  . Medial meniscus tear 09/2013    right knee  . 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 Tooth extraction    . Vasectomy    . Back surgery  1991    lower back  . Knee arthroscopy Right 10/23/2013    Procedure: RIGHT KNEE ARTHROSCOPY WITH MEDIAL MENISECTOMY AND CHONDROPLASTY;  Surgeon: Velna OchsPeter G Dalldorf, MD;   Location: [facility];  Service: Orthopedics;  Laterality: Right;    History   Social History  . Marital Status: Married    Spouse Name: N/A    Number of Children: 1  . Years of Education: N/A   Occupational History  . paint and body    Social History Main Topics  . Smoking status: Never Smoker   . Smokeless tobacco: Never Used  . Alcohol Use: Yes     Comment: "very little"  . Drug Use: No  . Sexual Activity: Yes   Other Topics Concern  . Not on file   Social History Narrative  . No narrative on file      Review of Systems  Constitutional: Negative for fever and unexpected weight change.  HENT: Negative for congestion, dental problem, ear pain, nosebleeds, postnasal drip, rhinorrhea, sinus pressure, sneezing, sore throat and trouble swallowing.   Eyes: Negative for redness and itching.  Respiratory: Negative for cough, chest tightness, shortness of breath and wheezing.   Cardiovascular: Negative for palpitations and leg swelling.  Gastrointestinal: Negative for nausea and vomiting.  Genitourinary: Negative for dysuria.  Musculoskeletal: Negative for joint swelling.  Skin: Negative for rash.  Neurological: Negative for headaches.  Hematological: Does not bruise/bleed easily.  Psychiatric/Behavioral: Negative for dysphoric mood. The patient is nervous/anxious.  Objective:   Physical Exam        Assessment & Plan:

## 2013-11-21 ENCOUNTER — Encounter (HOSPITAL_COMMUNITY): Payer: Self-pay

## 2013-11-23 ENCOUNTER — Ambulatory Visit (HOSPITAL_COMMUNITY)
Admission: RE | Admit: 2013-11-23 | Discharge: 2013-11-23 | Disposition: A | Discharge status: Home / Self Care | Payer: 59 | Source: Ambulatory Visit | Attending: Anesthesiology | Admitting: Anesthesiology

## 2013-11-23 ENCOUNTER — Encounter (HOSPITAL_COMMUNITY): Payer: Self-pay

## 2013-11-23 ENCOUNTER — Encounter (HOSPITAL_COMMUNITY)
Admission: RE | Admit: 2013-11-23 | Discharge: 2013-11-23 | Disposition: A | Discharge status: Home / Self Care | Payer: 59 | Source: Ambulatory Visit | Attending: Neurosurgery | Admitting: Neurosurgery

## 2013-11-23 DIAGNOSIS — Z01818 Encounter for other preprocedural examination: Secondary | ICD-10-CM | POA: Diagnosis present

## 2013-11-23 LAB — BASIC METABOLIC PANEL
Anion gap: 14 (ref 5–15)
BUN: 15 mg/dL (ref 6–23)
CALCIUM: 10.4 mg/dL (ref 8.4–10.5)
CO2: 26 meq/L (ref 19–32)
Chloride: 96 mEq/L (ref 96–112)
Creatinine, Ser: 0.88 mg/dL (ref 0.50–1.35)
GFR calc Af Amer: >90 mL/min (ref >90)
GLUCOSE: 92 mg/dL (ref 70–99)
POTASSIUM: 4.7 meq/L (ref 3.7–5.3)
Sodium: 136 mEq/L — ABNORMAL LOW (ref 137–147)

## 2013-11-23 LAB — CBC
HCT: 42.8 % (ref 39.0–52.0)
Hemoglobin: 14.8 g/dL (ref 13.0–17.0)
MCH: 28.5 pg (ref 26.0–34.0)
MCHC: 34.6 g/dL (ref 30.0–36.0)
MCV: 82.3 fL (ref 78.0–100.0)
PLATELETS: 310 10*3/uL (ref 150–400)
RBC: 5.2 MIL/uL (ref 4.22–5.81)
RDW: 12.6 % (ref 11.5–15.5)
WBC: 5.1 10*3/uL (ref 4.0–10.5)

## 2013-11-23 LAB — TYPE AND SCREEN
ABO/RH(D): O POS
ANTIBODY SCREEN: NEGATIVE

## 2013-11-23 LAB — SURGICAL PCR SCREEN
MRSA, PCR: NEGATIVE
Staphylococcus aureus: POSITIVE — AB

## 2013-11-23 LAB — ABO/RH: ABO/RH(D): O POS

## 2013-11-23 NOTE — Pre-Procedure Instructions (Signed)
Steven Murillo  11/23/2013   Your procedure is scheduled on:  12-04-2013 Tuesday   Report to [facility] Admitting at 7:30 AM.   Call this number if you have problems the morning of surgery: [phone]   Remember:   Do not eat food or drink liquids after midnight.    Take these medicines the morning of surgery with A SIP OF WATER: inhalers as needed,citalopram(celexa),cyclobenzaprine(Flexeril) if needed   Do not wear jewelry,   Do not wear lotions, powders, or perfumes. You may not wear deodorant.  Do not shave 48 hours prior to surgery. Men may shave face and neck.  Do not bring valuables to the hospital.  [facility] is not responsible for any belongings or valuables.               Contacts, dentures or bridgework may not be worn into surgery.   Leave suitcase in the car. After surgery it may be brought to your room.  For patients admitted to the hospital, discharge time is determined by your treatment team                               Patients discharged the day of surgery will not be allowed to drive home.     Special Instructions: See attached sheet for instructions for CHG shower/bath   Please read over the following fact sheets that you were given: Pain Booklet, Coughing and Deep Breathing, Blood Transfusion Information and Surgical Site Infection Prevention

## 2013-11-23 NOTE — Progress Notes (Signed)
Notified office that orders need to be signed,spoke with Vanessa,states that Dr.Kritzer has already left for today.  Nasal swab positive for staph,pt.notified and Rx. Called in to [facility] Rx.Marland Kitchen

## 2013-12-03 NOTE — Anesthesia Preprocedure Evaluation (Addendum)
Anesthesia Evaluation  Patient identified by MRN, date of birth, ID band Patient awake    Reviewed: Allergy & Precautions, H&P , NPO status , Patient's Chart, lab work & pertinent test results  Airway Mallampati: II      Dental  (+) Teeth Intact, Dental Advisory Given   Pulmonary asthma (on two inhalers) , sleep apnea ,  breath sounds clear to auscultation        Cardiovascular hypertension, Pt. on medications Rhythm:Regular Rate:Normal  EKG with LVH and repolarization   Neuro/Psych Anxiety    GI/Hepatic   Endo/Other    Renal/GU      Musculoskeletal   Abdominal (+)  Abdomen: soft.    Peds  Hematology   Anesthesia Other Findings   Reproductive/Obstetrics                         Anesthesia Physical Anesthesia Plan  ASA: III  Anesthesia Plan: General   Post-op Pain Management:    Induction: Intravenous  Airway Management Planned: Oral ETT  Additional Equipment:   Intra-op Plan:   Post-operative Plan: Extubation in OR  Informed Consent: I have reviewed the patients History and Physical, chart, labs and discussed the procedure including the risks, benefits and alternatives for the proposed anesthesia with the patient or authorized representative who has indicated his/her understanding and acceptance.     Plan Discussed with:   Anesthesia Plan Comments:         Anesthesia Quick Evaluation

## 2013-12-04 ENCOUNTER — Inpatient Hospital Stay (HOSPITAL_COMMUNITY)
Admission: RE | Admit: 2013-12-04 | Discharge: 2013-12-07 | DRG: 460 | Disposition: A | Discharge status: Home / Self Care | Payer: 59 | Source: Ambulatory Visit | Attending: Neurosurgery | Admitting: Neurosurgery

## 2013-12-04 ENCOUNTER — Encounter (HOSPITAL_COMMUNITY)
Admission: RE | Disposition: A | Discharge status: Home / Self Care | Payer: Self-pay | Source: Ambulatory Visit | Attending: Neurosurgery

## 2013-12-04 ENCOUNTER — Inpatient Hospital Stay (HOSPITAL_COMMUNITY): Payer: 59 | Admitting: Anesthesiology

## 2013-12-04 ENCOUNTER — Encounter (HOSPITAL_COMMUNITY): Payer: 59 | Admitting: Anesthesiology

## 2013-12-04 ENCOUNTER — Encounter (HOSPITAL_COMMUNITY): Payer: Self-pay | Admitting: Anesthesiology

## 2013-12-04 ENCOUNTER — Inpatient Hospital Stay (HOSPITAL_COMMUNITY): Payer: 59

## 2013-12-04 DIAGNOSIS — I10 Essential (primary) hypertension: Secondary | ICD-10-CM | POA: Diagnosis present

## 2013-12-04 DIAGNOSIS — Z79899 Other long term (current) drug therapy: Secondary | ICD-10-CM

## 2013-12-04 DIAGNOSIS — G8929 Other chronic pain: Secondary | ICD-10-CM | POA: Diagnosis present

## 2013-12-04 DIAGNOSIS — IMO0002 Reserved for concepts with insufficient information to code with codable children: Secondary | ICD-10-CM | POA: Diagnosis present

## 2013-12-04 DIAGNOSIS — J45909 Unspecified asthma, uncomplicated: Secondary | ICD-10-CM | POA: Diagnosis present

## 2013-12-04 DIAGNOSIS — M48061 Spinal stenosis, lumbar region without neurogenic claudication: Secondary | ICD-10-CM | POA: Diagnosis present

## 2013-12-04 DIAGNOSIS — M79609 Pain in unspecified limb: Secondary | ICD-10-CM | POA: Diagnosis present

## 2013-12-04 HISTORY — DX: Spondylosis without myelopathy or radiculopathy, thoracolumbar region: M47.815

## 2013-12-04 HISTORY — DX: Unspecified chronic bronchitis: J42

## 2013-12-04 HISTORY — PX: POSTERIOR LUMBAR FUSION: SHX6036

## 2013-12-04 HISTORY — DX: Pneumonia, unspecified organism: J18.9

## 2013-12-04 HISTORY — DX: Other chronic pain: G89.29

## 2013-12-04 HISTORY — DX: Spondylosis, unspecified: M47.9

## 2013-12-04 HISTORY — DX: Low back pain: M54.5

## 2013-12-04 HISTORY — DX: Low back pain, unspecified: M54.50

## 2013-12-04 SURGERY — POSTERIOR LUMBAR FUSION 1 LEVEL
Anesthesia: General | Site: Back

## 2013-12-04 MED ORDER — ARTIFICIAL TEARS OP OINT
TOPICAL_OINTMENT | OPHTHALMIC | Status: DC | PRN
  Administered 2013-12-04: 1 via OPHTHALMIC

## 2013-12-04 MED ORDER — PROPOFOL 10 MG/ML IV BOLUS
INTRAVENOUS | Status: AC
  Filled 2013-12-04: qty 20

## 2013-12-04 MED ORDER — PROMETHAZINE HCL 25 MG/ML IJ SOLN: 6.2500 mg | INTRAMUSCULAR | Status: DC | PRN

## 2013-12-04 MED ORDER — DEXAMETHASONE SODIUM PHOSPHATE 10 MG/ML IJ SOLN
INTRAMUSCULAR | Status: DC | PRN
  Administered 2013-12-04: 10 mg via INTRAVENOUS

## 2013-12-04 MED ORDER — VECURONIUM BROMIDE 10 MG IV SOLR
INTRAVENOUS | Status: DC | PRN
  Administered 2013-12-04 (×5): 2 mg via INTRAVENOUS

## 2013-12-04 MED ORDER — DEXMEDETOMIDINE HCL 200 MCG/2ML IV SOLN
INTRAVENOUS | Status: DC | PRN
  Administered 2013-12-04: 4 ug via INTRAVENOUS
  Administered 2013-12-04: 8 ug via INTRAVENOUS
  Administered 2013-12-04: 4 ug via INTRAVENOUS
  Administered 2013-12-04: 12 ug via INTRAVENOUS
  Administered 2013-12-04 (×3): 4 ug via INTRAVENOUS
  Administered 2013-12-04: 8 ug via INTRAVENOUS

## 2013-12-04 MED ORDER — 0.9 % SODIUM CHLORIDE (POUR BTL) OPTIME
TOPICAL | Status: DC | PRN
  Administered 2013-12-04: 1000 mL

## 2013-12-04 MED ORDER — CYCLOBENZAPRINE HCL 10 MG PO TABS
10.0000 mg | ORAL_TABLET | Freq: Three times a day (TID) | ORAL | Status: DC | PRN
  Administered 2013-12-04 – 2013-12-07 (×7): 10 mg via ORAL
  Filled 2013-12-04 (×8): qty 1

## 2013-12-04 MED ORDER — HYDROCHLOROTHIAZIDE 12.5 MG PO CAPS
12.5000 mg | ORAL_CAPSULE | Freq: Every day | ORAL | Status: DC
  Administered 2013-12-06 – 2013-12-07 (×2): 12.5 mg via ORAL
  Filled 2013-12-04 (×3): qty 1

## 2013-12-04 MED ORDER — ROCURONIUM BROMIDE 100 MG/10ML IV SOLN
INTRAVENOUS | Status: DC | PRN
  Administered 2013-12-04: 50 mg via INTRAVENOUS

## 2013-12-04 MED ORDER — ZOLPIDEM TARTRATE 5 MG PO TABS
5.0000 mg | ORAL_TABLET | Freq: Every evening | ORAL | Status: DC | PRN
  Administered 2013-12-06 (×2): 5 mg via ORAL
  Filled 2013-12-04 (×2): qty 1

## 2013-12-04 MED ORDER — HYDROMORPHONE HCL PF 1 MG/ML IJ SOLN
1.0000 mg | INTRAMUSCULAR | Status: DC | PRN
  Administered 2013-12-04 – 2013-12-06 (×2): 1.5 mg via INTRAMUSCULAR
  Filled 2013-12-04 (×2): qty 2

## 2013-12-04 MED ORDER — CITALOPRAM HYDROBROMIDE 40 MG PO TABS
40.0000 mg | ORAL_TABLET | Freq: Every day | ORAL | Status: DC
  Administered 2013-12-05 – 2013-12-07 (×3): 40 mg via ORAL
  Filled 2013-12-04 (×3): qty 1

## 2013-12-04 MED ORDER — VANCOMYCIN HCL 1000 MG IV SOLR
INTRAVENOUS | Status: AC
Start: 2013-12-04 — End: 2013-12-05
  Filled 2013-12-04: qty 1000

## 2013-12-04 MED ORDER — LIDOCAINE HCL (CARDIAC) 20 MG/ML IV SOLN
INTRAVENOUS | Status: DC | PRN
  Administered 2013-12-04: 90 mg via INTRAVENOUS

## 2013-12-04 MED ORDER — KETAMINE HCL 10 MG/ML IJ SOLN
INTRAMUSCULAR | Status: DC | PRN
  Administered 2013-12-04: 20 mg via INTRAVENOUS

## 2013-12-04 MED ORDER — CEFAZOLIN SODIUM-DEXTROSE 2-3 GM-% IV SOLR
INTRAVENOUS | Status: AC
  Administered 2013-12-04: 2 g via INTRAVENOUS
  Filled 2013-12-04: qty 50

## 2013-12-04 MED ORDER — ALUM & MAG HYDROXIDE-SIMETH 200-200-20 MG/5ML PO SUSP: 30.0000 mL | Freq: Four times a day (QID) | ORAL | Status: DC | PRN

## 2013-12-04 MED ORDER — BUPIVACAINE LIPOSOME 1.3 % IJ SUSP
20.0000 mL | Freq: Once | INTRAMUSCULAR | Status: DC
  Filled 2013-12-04: qty 20

## 2013-12-04 MED ORDER — BUPIVACAINE LIPOSOME 1.3 % IJ SUSP
INTRAMUSCULAR | Status: DC | PRN
  Administered 2013-12-04: 20 mL

## 2013-12-04 MED ORDER — FENTANYL CITRATE 0.05 MG/ML IJ SOLN
25.0000 ug | INTRAMUSCULAR | Status: DC | PRN
  Administered 2013-12-04 (×3): 50 ug via INTRAVENOUS

## 2013-12-04 MED ORDER — SUFENTANIL CITRATE 50 MCG/ML IV SOLN
INTRAVENOUS | Status: DC | PRN
  Administered 2013-12-04 (×3): 5 ug via INTRAVENOUS
  Administered 2013-12-04: 15 ug via INTRAVENOUS
  Administered 2013-12-04 (×4): 5 ug via INTRAVENOUS

## 2013-12-04 MED ORDER — ROCURONIUM BROMIDE 50 MG/5ML IV SOLN
INTRAVENOUS | Status: AC
  Filled 2013-12-04: qty 1

## 2013-12-04 MED ORDER — MEPERIDINE HCL 25 MG/ML IJ SOLN: 6.2500 mg | INTRAMUSCULAR | Status: DC | PRN

## 2013-12-04 MED ORDER — LISINOPRIL-HYDROCHLOROTHIAZIDE 20-12.5 MG PO TABS: 1.0000 | ORAL_TABLET | Freq: Every day | ORAL | Status: DC

## 2013-12-04 MED ORDER — ACETAMINOPHEN 10 MG/ML IV SOLN
INTRAVENOUS | Status: DC | PRN
  Administered 2013-12-04: 1000 mg via INTRAVENOUS

## 2013-12-04 MED ORDER — ACETAMINOPHEN 650 MG RE SUPP: 650.0000 mg | RECTAL | Status: DC | PRN

## 2013-12-04 MED ORDER — LACTATED RINGERS IV SOLN
INTRAVENOUS | Status: DC
  Administered 2013-12-04: 08:00:00 via INTRAVENOUS

## 2013-12-04 MED ORDER — KCL IN DEXTROSE-NACL 20-5-0.45 MEQ/L-%-% IV SOLN
80.0000 mL/h | INTRAVENOUS | Status: DC
  Administered 2013-12-04: 80 mL/h via INTRAVENOUS
  Filled 2013-12-04: qty 1000

## 2013-12-04 MED ORDER — ONDANSETRON HCL 4 MG/2ML IJ SOLN
INTRAMUSCULAR | Status: DC | PRN
  Administered 2013-12-04: 4 mg via INTRAVENOUS

## 2013-12-04 MED ORDER — PROPOFOL 10 MG/ML IV BOLUS
INTRAVENOUS | Status: DC | PRN
  Administered 2013-12-04: 180 mg via INTRAVENOUS

## 2013-12-04 MED ORDER — ACETAMINOPHEN 325 MG PO TABS: 650.0000 mg | ORAL_TABLET | ORAL | Status: DC | PRN

## 2013-12-04 MED ORDER — PHENOL 1.4 % MT LIQD: 1.0000 | OROMUCOSAL | Status: DC | PRN

## 2013-12-04 MED ORDER — PHENYLEPHRINE HCL 10 MG/ML IJ SOLN
10.0000 mg | INTRAVENOUS | Status: DC | PRN
  Administered 2013-12-04: 20 ug/min via INTRAVENOUS

## 2013-12-04 MED ORDER — DEXAMETHASONE SODIUM PHOSPHATE 10 MG/ML IJ SOLN
INTRAMUSCULAR | Status: AC
  Filled 2013-12-04: qty 1

## 2013-12-04 MED ORDER — PANTOPRAZOLE SODIUM 40 MG IV SOLR
40.0000 mg | Freq: Every day | INTRAVENOUS | Status: DC
  Administered 2013-12-04: 40 mg via INTRAVENOUS
  Filled 2013-12-04: qty 40

## 2013-12-04 MED ORDER — MENTHOL 3 MG MT LOZG: 1.0000 | LOZENGE | OROMUCOSAL | Status: DC | PRN

## 2013-12-04 MED ORDER — ONDANSETRON HCL 4 MG/2ML IJ SOLN: 4.0000 mg | INTRAMUSCULAR | Status: DC | PRN

## 2013-12-04 MED ORDER — PHENYLEPHRINE HCL 10 MG/ML IJ SOLN
INTRAMUSCULAR | Status: DC | PRN
  Administered 2013-12-04 (×6): 40 ug via INTRAVENOUS

## 2013-12-04 MED ORDER — VANCOMYCIN HCL 1000 MG IV SOLR
INTRAVENOUS | Status: DC | PRN
  Administered 2013-12-04: 1000 mg

## 2013-12-04 MED ORDER — KETAMINE HCL 100 MG/ML IJ SOLN
INTRAMUSCULAR | Status: AC
  Filled 2013-12-04: qty 1

## 2013-12-04 MED ORDER — SODIUM CHLORIDE 0.9 % IJ SOLN: 3.0000 mL | INTRAMUSCULAR | Status: DC | PRN

## 2013-12-04 MED ORDER — NEOSTIGMINE METHYLSULFATE 10 MG/10ML IV SOLN
INTRAVENOUS | Status: DC | PRN
  Administered 2013-12-04: 4 mg via INTRAVENOUS

## 2013-12-04 MED ORDER — FENTANYL CITRATE 0.05 MG/ML IJ SOLN
INTRAMUSCULAR | Status: AC
  Filled 2013-12-04: qty 2

## 2013-12-04 MED ORDER — LIDOCAINE HCL (CARDIAC) 20 MG/ML IV SOLN
INTRAVENOUS | Status: AC
  Filled 2013-12-04: qty 5

## 2013-12-04 MED ORDER — OXYCODONE-ACETAMINOPHEN 5-325 MG PO TABS
1.0000 | ORAL_TABLET | ORAL | Status: DC | PRN
  Administered 2013-12-04 – 2013-12-05 (×5): 2 via ORAL
  Filled 2013-12-04 (×5): qty 2

## 2013-12-04 MED ORDER — MIDAZOLAM HCL 2 MG/2ML IJ SOLN
INTRAMUSCULAR | Status: AC
Start: 2013-12-04 — End: 2013-12-04
  Filled 2013-12-04: qty 2

## 2013-12-04 MED ORDER — LACTATED RINGERS IV SOLN
INTRAVENOUS | Status: DC | PRN
  Administered 2013-12-04 (×3): via INTRAVENOUS

## 2013-12-04 MED ORDER — MIDAZOLAM HCL 5 MG/5ML IJ SOLN
INTRAMUSCULAR | Status: DC | PRN
  Administered 2013-12-04: 2 mg via INTRAVENOUS

## 2013-12-04 MED ORDER — LISINOPRIL 20 MG PO TABS
20.0000 mg | ORAL_TABLET | Freq: Every day | ORAL | Status: DC
  Administered 2013-12-06 – 2013-12-07 (×2): 20 mg via ORAL
  Filled 2013-12-04 (×3): qty 1

## 2013-12-04 MED ORDER — CEFAZOLIN SODIUM-DEXTROSE 2-3 GM-% IV SOLR
2.0000 g | Freq: Three times a day (TID) | INTRAVENOUS | Status: AC
  Administered 2013-12-04 – 2013-12-05 (×2): 2 g via INTRAVENOUS
  Filled 2013-12-04 (×2): qty 50

## 2013-12-04 MED ORDER — MOMETASONE FURO-FORMOTEROL FUM 100-5 MCG/ACT IN AERO
2.0000 | INHALATION_SPRAY | Freq: Two times a day (BID) | RESPIRATORY_TRACT | Status: DC
  Administered 2013-12-04 – 2013-12-07 (×4): 2 via RESPIRATORY_TRACT
  Filled 2013-12-04 (×2): qty 8.8

## 2013-12-04 MED ORDER — GLYCOPYRROLATE 0.2 MG/ML IJ SOLN
INTRAMUSCULAR | Status: DC | PRN
  Administered 2013-12-04: .8 mg via INTRAVENOUS

## 2013-12-04 MED ORDER — SODIUM CHLORIDE 0.9 % IR SOLN
Status: DC | PRN
  Administered 2013-12-04: 10:00:00

## 2013-12-04 MED ORDER — SODIUM CHLORIDE 0.9 % IJ SOLN
3.0000 mL | Freq: Two times a day (BID) | INTRAMUSCULAR | Status: DC
  Administered 2013-12-04 – 2013-12-07 (×6): 3 mL via INTRAVENOUS

## 2013-12-04 MED ORDER — DOCUSATE SODIUM 100 MG PO CAPS
100.0000 mg | ORAL_CAPSULE | Freq: Two times a day (BID) | ORAL | Status: DC
  Administered 2013-12-04 – 2013-12-07 (×6): 100 mg via ORAL
  Filled 2013-12-04 (×6): qty 1

## 2013-12-04 MED ORDER — THROMBIN 20000 UNITS EX SOLR
CUTANEOUS | Status: DC | PRN
  Administered 2013-12-04: 10:00:00 via TOPICAL

## 2013-12-04 MED ORDER — SUFENTANIL CITRATE 50 MCG/ML IV SOLN
INTRAVENOUS | Status: AC
  Filled 2013-12-04: qty 1

## 2013-12-04 SURGICAL SUPPLY — 71 items
ADH SKN CLS APL DERMABOND .7 (GAUZE/BANDAGES/DRESSINGS)
APL SKNCLS STERI-STRIP NONHPOA (GAUZE/BANDAGES/DRESSINGS) ×2
BAG DECANTER FOR FLEXI CONT (MISCELLANEOUS) ×3 IMPLANT
BENZOIN TINCTURE PRP APPL 2/3 (GAUZE/BANDAGES/DRESSINGS) ×6 IMPLANT
BLADE SURG ROTATE 9660 (MISCELLANEOUS) IMPLANT
BONE EQUIVA 10CC (Bone Implant) ×2 IMPLANT
BRUSH SCRUB EZ PLAIN DRY (MISCELLANEOUS) ×3 IMPLANT
BUR CUTTER 7.0 ROUND (BURR) ×3 IMPLANT
BUR MATCHSTICK NEURO 3.0 LAGG (BURR) ×3 IMPLANT
CANISTER SUCT 3000ML (MISCELLANEOUS) ×3 IMPLANT
CLOSURE WOUND 1/2 X4 (GAUZE/BANDAGES/DRESSINGS) ×1
CONT SPEC 4OZ CLIKSEAL STRL BL (MISCELLANEOUS) ×6 IMPLANT
COVER BACK TABLE 24X17X13 BIG (DRAPES) IMPLANT
COVER TABLE BACK 60X90 (DRAPES) ×3 IMPLANT
DERMABOND ADVANCED (GAUZE/BANDAGES/DRESSINGS)
DERMABOND ADVANCED .7 DNX12 (GAUZE/BANDAGES/DRESSINGS) IMPLANT
DRAPE C-ARM 42X72 X-RAY (DRAPES) ×6 IMPLANT
DRAPE C-ARMOR (DRAPES) ×2 IMPLANT
DRAPE LAPAROTOMY 100X72X124 (DRAPES) ×3 IMPLANT
DRAPE SURG 17X23 STRL (DRAPES) ×6 IMPLANT
DRSG OPSITE POSTOP 4X6 (GAUZE/BANDAGES/DRESSINGS) ×3 IMPLANT
DRSG OPSITE POSTOP 4X8 (GAUZE/BANDAGES/DRESSINGS) ×2 IMPLANT
DRSG TELFA 3X8 NADH (GAUZE/BANDAGES/DRESSINGS) ×3 IMPLANT
DURAPREP 26ML APPLICATOR (WOUND CARE) ×3 IMPLANT
ELECT REM PT RETURN 9FT ADLT (ELECTROSURGICAL) ×3
ELECTRODE REM PT RTRN 9FT ADLT (ELECTROSURGICAL) ×1 IMPLANT
EVACUATOR 1/8 PVC DRAIN (DRAIN) ×3 IMPLANT
GAUZE SPONGE 4X4 12PLY STRL (GAUZE/BANDAGES/DRESSINGS) ×3 IMPLANT
GAUZE SPONGE 4X4 16PLY XRAY LF (GAUZE/BANDAGES/DRESSINGS) IMPLANT
GLOVE ECLIPSE 8.0 STRL XLNG CF (GLOVE) ×6 IMPLANT
GLOVE EXAM NITRILE LRG STRL (GLOVE) ×4 IMPLANT
GLOVE EXAM NITRILE MD LF STRL (GLOVE) IMPLANT
GLOVE EXAM NITRILE XS STR PU (GLOVE) IMPLANT
GOWN STRL REUS W/ TWL LRG LVL3 (GOWN DISPOSABLE) IMPLANT
GOWN STRL REUS W/ TWL XL LVL3 (GOWN DISPOSABLE) ×2 IMPLANT
GOWN STRL REUS W/TWL 2XL LVL3 (GOWN DISPOSABLE) IMPLANT
GOWN STRL REUS W/TWL LRG LVL3 (GOWN DISPOSABLE) ×3
GOWN STRL REUS W/TWL XL LVL3 (GOWN DISPOSABLE) ×6
HANDLE PEDIGUARD CANNULATED (INSTRUMENTS) ×6 IMPLANT
IMPLANT ARDIS PEEK 10X9X26 (Orthopedic Implant) ×4 IMPLANT
K-WIRE NITHNOL TROCAR TIP (WIRE) ×8 IMPLANT
KIT BASIN OR (CUSTOM PROCEDURE TRAY) ×3 IMPLANT
KIT ROOM TURNOVER OR (KITS) ×3 IMPLANT
NDL HYPO 21X1 ECLIPSE (NEEDLE) IMPLANT
NEEDLE HYPO 21X1 ECLIPSE (NEEDLE) ×3 IMPLANT
NEEDLE HYPO 22GX1.5 SAFETY (NEEDLE) ×3 IMPLANT
NS IRRIG 1000ML POUR BTL (IV SOLUTION) ×3 IMPLANT
PACK LAMINECTOMY NEURO (CUSTOM PROCEDURE TRAY) ×3 IMPLANT
PAD ARMBOARD 7.5X6 YLW CONV (MISCELLANEOUS) ×9 IMPLANT
PAD DRESSING TELFA 3X8 NADH (GAUZE/BANDAGES/DRESSINGS) ×1 IMPLANT
PATTIES SURGICAL .75X.75 (GAUZE/BANDAGES/DRESSINGS) IMPLANT
ROD BENT 40MM (Rod) ×4 IMPLANT
SCREW POLYAXIA MIS 6.5X40MM (Screw) ×8 IMPLANT
SHEATH PAT (SHEATH) ×2 IMPLANT
SPONGE LAP 4X18 X RAY DECT (DISPOSABLE) IMPLANT
SPONGE SURGIFOAM ABS GEL 100 (HEMOSTASIS) ×1 IMPLANT
STRIP CLOSURE SKIN 1/2X4 (GAUZE/BANDAGES/DRESSINGS) ×3 IMPLANT
SUT PROLENE 0 CT 1 30 (SUTURE) IMPLANT
SUT VIC AB 0 CT1 18XCR BRD8 (SUTURE) ×1 IMPLANT
SUT VIC AB 0 CT1 8-18 (SUTURE) ×6
SUT VIC AB 2-0 OS6 18 (SUTURE) ×9 IMPLANT
SUT VIC AB 3-0 CP2 18 (SUTURE) ×3 IMPLANT
SYR 20CC LL (SYRINGE) ×2 IMPLANT
SYR 20ML ECCENTRIC (SYRINGE) ×3 IMPLANT
TOP CLSR SEQUOIA (Orthopedic Implant) ×8 IMPLANT
TOWEL OR 17X24 6PK STRL BLUE (TOWEL DISPOSABLE) ×3 IMPLANT
TOWEL OR 17X26 10 PK STRL BLUE (TOWEL DISPOSABLE) ×3 IMPLANT
TRAP SPECIMEN MUCOUS 40CC (MISCELLANEOUS) ×3 IMPLANT
TRAY FOLEY CATH 14FRSI W/METER (CATHETERS) ×1 IMPLANT
TRAY FOLEY METER SIL LF 16FR (CATHETERS) ×2 IMPLANT
WATER STERILE IRR 1000ML POUR (IV SOLUTION) ×3 IMPLANT

## 2013-12-04 NOTE — H&P (Signed)
Steven Murillo is an 38 y.o. male.   Chief Complaint: Low back pain into the legs HPI: The patient is a 38 year old gentleman is here for evaluation of lower back pain to both legs worse on the right than the left. He's had this problem for years.) Had low back surgery age of 76 orthopedist did fairly well for a few years. Last year she's been developed lower back issues been seen pain management specialist. He tried injections chiropractic treatment massage therapy and physical therapy without relief. He underwent imaging studies which showed significant disease at L4-5. He was tried on additional conservative therapy still without improvement. He therefore requested surgery and now comes for an L4-5 decompression with interbody fusion and pedicle screw fixation. I've had a long discussion with him regarding the risks and benefits of surgical intervention. The risks discussed include but are not limited to bleeding infection weakness numbness paralysis spinal fluid leak trouble with instrumentation nonunion coma and death. We have discussed alternative methods of therapy offered risks and benefits of nonintervention. He's had the opportunity to ask numerous questions and appears to understand. With this information in hand he has requested proceed with surgery and he is admitted this time for the procedure.  Past Medical History  Diagnosis Date  . Anxiety   . Chronic back pain 06/12/2013  . Asthma     daily and prn inhalers  . Hypertension     started on BP med. first week of July 2015  . Medial meniscus tear 09/2013    right knee  . 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 Tooth extraction    . Vasectomy    . Back surgery  1991    lower back  . Knee arthroscopy Right 10/23/2013    Procedure: RIGHT KNEE ARTHROSCOPY WITH MEDIAL MENISECTOMY AND CHONDROPLASTY;  Surgeon: Velna Ochs, MD;  Location: [facility];  Service:  Orthopedics;  Laterality: Right;    Family History  Problem Relation Age of Onset  . Sleep apnea Father   . Diabetes Father   . Heart disease Father   . Alcohol abuse Brother   . Alcohol abuse Paternal Grandfather   . Asthma Maternal Grandmother    Social History:  reports that he has never smoked. He has never used smokeless tobacco. He reports that he drinks alcohol. He reports that he does not use illicit drugs.  Allergies:  Allergies  Allergen Reactions  . Contrast Media [Iodinated Diagnostic Agents]     vomiting    Medications Prior to Admission  Medication Sig Dispense Refill  . albuterol (PROVENTIL HFA;VENTOLIN HFA) 108 (90 BASE) MCG/ACT inhaler Inhale 2 puffs into the lungs every 6 (six) hours as needed for wheezing or shortness of breath.       . celecoxib (CELEBREX) 200 MG capsule Take 200 mg by mouth daily.      . citalopram (CELEXA) 40 MG tablet Take 1 tablet (40 mg total) by mouth daily.  30 tablet  5  . cyclobenzaprine (FLEXERIL) 10 MG tablet Take 10 mg by mouth 3 (three) times daily as needed for muscle spasms.      . Fluticasone-Salmeterol (ADVAIR) 100-50 MCG/DOSE AEPB Inhale 1 puff into the lungs 2 (two) times daily.      Marland Kitchen lisinopril-hydrochlorothiazide (PRINZIDE,ZESTORETIC) 20-12.5 MG per tablet Take 1 tablet by mouth daily.  30 tablet  2    No results found for this or any previous  visit (from the past 48 hour(s)). No results found.  A comprehensive review of systems was negative.  Blood pressure 136/84, pulse 103, temperature 97.9 F (36.6 C), temperature source Oral, resp. rate 20, weight 85.276 kg (188 lb), SpO2 100.00%.  The patient is awake or and oriented. His no facial asymmetry. He has some weakness of extensor pollicis longus on the left. He has 1+ knee jerk and absent ankle jerks normal sensation Assessment/Plan Impression is that of disease at L4-5 previous surgery. The plan is for an L4-5 decompression with discectomy interbody fusion and  pedicle screw fixation.  [name], MD 12/04/2013, 9:43 AM

## 2013-12-04 NOTE — Transfer of Care (Signed)
Immediate Anesthesia Transfer of Care Note  Patient: Steven Murillo [facility]  Procedure(s) Performed: Procedure(s): POSTERIOR LUMBAR FUSION LUMBAR FOUR-FIVE WITH PERCUTANEOUS SCREWS (N/A)  Patient Location: PACU  Anesthesia Type:General  Level of Consciousness: awake and alert   Airway & Oxygen Therapy: Patient Spontanous Breathing and Patient connected to nasal cannula oxygen  Post-op Assessment: Report given to PACU RN, Post -op Vital signs reviewed and stable and Patient moving all extremities  Post vital signs: Reviewed and stable  Complications: No apparent anesthesia complications.

## 2013-12-04 NOTE — Op Note (Signed)
Preop diagnosis: Spinal stenosis and herniated disc with central and lateral recess stenosis L4-5 status post L4-5 decompressive laminotomy Postop diagnosis: Same Procedure: Bilateral L4-5 decompressive laminectomy for resolution of central and lateral recess stenosis Bilateral L4-5 microdiscectomy A lateral L4-5 microdiscectomy L4-5 posterior lumbar interbody fusion with peek interbody spacer L4-5 posterior lateral fusion L4-5 nonsegmental instrumentation with Pathfinder percutaneous pedicle screw system Surgeon: Insurance risk surveyor: Conchita Paris  After and placed the prone position the patient's back was prepped and draped in the usual sterile fashion. Localizing x-ray was used prior to incision to identify the appropriate level. Midline incision was made above the spinous processes of L4 and L5. Bovie cutting current was used to carry the incision down to the dorsal lumbar fascia. The plane between the fascia and the superficial subcutaneous tissue is dissected free. We then did a subperiosteal dissection on the spinous processes lamina facet joint at L4-5 bilaterally. Self-retaining tract was placed for exposure and x-ray showed approach the appropriate level. Spinous processes were removed. Starting the patient's left side generous laminotomy was performed removing the inferior two thirds of the L4 lamina the medial two thirds of the facet joint and the superior one third of the L5 lamina. Residual bone and hypertrophic ligamentum flavum and residual scar from previous surgery removed. Lateral recess was decompressed nicely at the pressure of the L5 nerve root. Similar decompression was then carried on the opposite side once again decompressing the L3 the lateral recess. We then removed the residual midline structures to complete the midline decompression with relief of the midline stenosis. We then identified and incised the disc and thoroughly cleaned out bilaterally with pituitary rongeurs and  curettes. We then prepared the disc for interbody fusion. We distracted up to a 10 mm size and felt this was a good choice. We then thoroughly prepared the disc for interbody fusion chose to a peek interbody spacers of 9 x 10 x 26 mm size. Filled with a mixture of autologous bone morselized allograft. We irrigated controlled any bleeding impacted the cage without difficulty. Prior to placing the second cage with placed a mixture of autologous bone morselized allograft it within the interspace to help with interbody fusion. We then decorticated the facet joint placed a mixture of autologous bone morselized allograft to perform posterolateral fusion. We then irrigated copiously 1 stitch and the fascia and placed a percutaneous pedicle screws in standard fashion. Passed the Jamshidi needle with the ultrasonic guide. Placed wires and incised the fascia between the 2 wires. We then tapped with the 6.0 mm tap and then placed 6.5 x 40 mm screws at L4 and L5 bilaterally. We then chose appropriate length rods passing of the tower and secured them to the top of the screws with top loading nuts. We did tightening and final tightening with torque and counter torque then removed the [employer]. Fluoroscopy in AP lateral direction looked excellent. We irrigated once more in multiple layers. We cut a single stitch the midline dorsal fascia and placed an epidural drain in the epidural space. We closed the midline dorsal fascia the lateral fashion with 0 Vicryl. We irrigated once more and then closed the residual layers with interrupted Vicryl on the subcutaneous and subcuticular tissues. A running locking Prolene was placed on the skin. Shortness was then applied the patient extubated covered stable condition.

## 2013-12-04 NOTE — Anesthesia Postprocedure Evaluation (Signed)
  Anesthesia Post-op Note  Patient: Steven Murillo  Procedure(s) Performed: Procedure(s): POSTERIOR LUMBAR FUSION LUMBAR FOUR-FIVE WITH PERCUTANEOUS SCREWS (N/A)  Patient Location: PACU  Anesthesia Type:General  Level of Consciousness: awake  Airway and Oxygen Therapy: Patient Spontanous Breathing and Patient connected to nasal cannula oxygen  Post-op Pain: mild  Post-op Assessment: Post-op Vital signs reviewed, Patient's Cardiovascular Status Stable, Respiratory Function Stable, Patent Airway and No signs of Nausea or vomiting  Post-op Vital Signs: Reviewed and stable  Last Vitals:  Filed Vitals:   12/04/13 1420  BP:   Pulse: 116  Temp:   Resp: 12    Complications: No apparent anesthesia complications

## 2013-12-04 NOTE — Plan of Care (Signed)
Problem: Consults Goal: Diagnosis - Spinal Surgery Outcome: Completed/Met Date Met:  12/04/13 Thoraco/Lumbar Spine Fusion

## 2013-12-05 ENCOUNTER — Encounter (HOSPITAL_COMMUNITY): Payer: Self-pay | Admitting: General Practice

## 2013-12-05 MED ORDER — PANTOPRAZOLE SODIUM 40 MG PO TBEC
40.0000 mg | DELAYED_RELEASE_TABLET | Freq: Every evening | ORAL | Status: DC
  Administered 2013-12-05 – 2013-12-06 (×2): 40 mg via ORAL
  Filled 2013-12-05 (×2): qty 1

## 2013-12-05 MED ORDER — INFLUENZA VAC SPLIT QUAD 0.5 ML IM SUSY
0.5000 mL | PREFILLED_SYRINGE | Freq: Once | INTRAMUSCULAR | Status: AC
  Administered 2013-12-05: 0.5 mL via INTRAMUSCULAR
  Filled 2013-12-05 (×2): qty 0.5

## 2013-12-05 MED ORDER — PNEUMOCOCCAL VAC POLYVALENT 25 MCG/0.5ML IJ INJ
0.5000 mL | INJECTION | Freq: Once | INTRAMUSCULAR | Status: AC
  Administered 2013-12-05: 0.5 mL via INTRAMUSCULAR

## 2013-12-05 MED ORDER — HYDROMORPHONE HCL 2 MG PO TABS
4.0000 mg | ORAL_TABLET | ORAL | Status: DC | PRN
  Administered 2013-12-05 – 2013-12-07 (×9): 4 mg via ORAL
  Filled 2013-12-05 (×9): qty 2

## 2013-12-05 NOTE — Progress Notes (Signed)
Dr. Gerlene Fee called back with order. Patient notified. Will continue to monitor.  Sim Boast, RN

## 2013-12-05 NOTE — Progress Notes (Signed)
Patient ID: Steven Murillo, male   DOB: 12/12/1975, 38 y.o.   MRN: [id number] Afeb, vss. No new neuro issues. Pain well controlled with po dilaudid. Wound fine, drain removed. Home tomorrow if continues to do well.

## 2013-12-05 NOTE — Progress Notes (Signed)
Removed foley catheter after pt ambulated in room at 2145 12/04/13, output. Pt tolerated ambulation and catheter removal well. Will continue to monitor.

## 2013-12-05 NOTE — Progress Notes (Signed)
UR complete.  Taylan Marez RN, MSN 

## 2013-12-05 NOTE — Progress Notes (Signed)
Patient verbalized that his pain medication only given relief for about an hour. Patient refused IM dilaudid verbalizing that it is not working. MD called. Left message with MD nurse. Awaiting on call back. Will continue to monitor.  Sim Boast, RN

## 2013-12-06 MED ORDER — KETOROLAC TROMETHAMINE 30 MG/ML IJ SOLN
30.0000 mg | Freq: Four times a day (QID) | INTRAMUSCULAR | Status: DC
  Administered 2013-12-06 – 2013-12-07 (×4): 30 mg via INTRAVENOUS
  Filled 2013-12-06 (×4): qty 1

## 2013-12-06 MED ORDER — DIAZEPAM 5 MG PO TABS
5.0000 mg | ORAL_TABLET | Freq: Once | ORAL | Status: AC
  Administered 2013-12-06: 5 mg via ORAL
  Filled 2013-12-06: qty 1

## 2013-12-06 NOTE — Progress Notes (Signed)
Patient attempted to voids but unable. Bladder scanned of . In and out cath performed and removed . Encourage oral fluids. Side effects of pain medication reviewed again with patient and family. Ambulation encourage. Will continue tonitor.   Sim Boast, RN

## 2013-12-06 NOTE — Progress Notes (Signed)
Pt c/o of more pain in back radiating to both legs with some tingling, moaning and groaning in pain,rapid respond RN Gunnar Fusi paged to see pt, who came and examined pt's incision site,clean with sutures in place, Dr Franky Macho re paged and notified,ordered to give iv toradol  every 6hrs for 4 doses same commenced at 0415, v/s stable, will continue to  Monitor Steven Murillo, Steven Murillo

## 2013-12-06 NOTE — Progress Notes (Signed)
Pt had a "rough night," with episode of severe back and bilateral thigh pain. Toradol was given which helped. Currently resting comfortably in bed. Was able to ambulate in hallway yesterday, tolerating diet, and voiding normally.  EXAM:  BP 126/78  Pulse 113  Temp(Src) 98.9 F (37.2 C) (Oral)  Resp 20  Ht  (1.753 m)  Wt 85.276 kg (188 lb)  SpO2 100%  Awake, alert, oriented  Good strength BLE  IMPRESSION:  38 y.o. male POD#2 s/p L4-5 PLIF, recovering as expected  PLAN: - Cont to mobilize with walker - Pain control with PO dilaudid/flexeril - Likely d/c home tomorrow

## 2013-12-06 NOTE — Progress Notes (Signed)
Pt c/o of severe pain,unable to move,was earlier medicated with oral dilaudid  last at 0142, Dr Marianna Fuss call) paged and notified, said to give a one time dose of valium  and to give the ordered im dilaudid, same given at 0221, pt reassured,will however continue to monitor. Obasogie-Asidi, Palmina Clodfelter Efe

## 2013-12-06 NOTE — Progress Notes (Signed)
Called by Michele Mcalpine, RN for a "second set of eyes" on this pt who is having increasing back pain radiating into bilateral thighs.  On arrival to room pt restless on back in bed c/o 9/10 LBP with radiation into bilateral thighs associated with tingling but denies numbness.  Pt able to raise bilat LE off bed, fllex and dorsiflex bilat feet.  Pedal pulses 2+, skin warm and dry.  Lumbar incision OTA, sutures intact, no active bleeding noted.128/91  120  22 100% O2 sats on room air.Given Toradol 30 mg IV per Dr Franky Macho with effect in "easing off" pts pain.  Hand off report given to Michele Mcalpine, RN.  Will continue to support pt as needed

## 2013-12-07 MED ORDER — CELECOXIB 200 MG PO CAPS: 200.0000 mg | ORAL_CAPSULE | Freq: Every day | ORAL | Status: DC

## 2013-12-07 MED ORDER — HYDROMORPHONE HCL 4 MG PO TABS: 4.0000 mg | ORAL_TABLET | ORAL | Status: DC | PRN

## 2013-12-07 MED ORDER — CYCLOBENZAPRINE HCL 10 MG PO TABS: 10.0000 mg | ORAL_TABLET | Freq: Three times a day (TID) | ORAL | Status: AC | PRN

## 2013-12-07 NOTE — Discharge Summary (Signed)
  Physician Discharge Summary  Patient ID: GOLDMAN BIRCHALL MRN: [id number] DOB/AGE: Dec 21, 1975 38 y.o.  Admit date: 12/04/2013 Discharge date: 12/07/2013  Admission Diagnoses: Lumbar spinal stenosis  Discharge Diagnoses: Same Active Problems:   Lumbar spinal stenosis   Discharged Condition: Stable  Hospital Course:  Mrs. Steven Murillo is a 38 y.o. male admitted after elective, uncomplicated L4-5 PLIF. His postoperative recovery was uneventful, with gradual pain control. He was ambulating well, had one episode of urinary retention but was otherwise voiding normally, and was tolerating diet. He was stable for d/c on POD#3.  Treatments: Surgery - L4-5 PLIF  Discharge Exam: Blood pressure 101/53, pulse 91, temperature 98 F (36.7 C), temperature source Oral, resp. rate 18, height  (1.753 m), weight 85.276 kg (188 lb), SpO2 98.00%. Awake, alert, oriented Speech fluent, appropriate CN grossly intact 5/5 BUE/BLE Wound c/d/i  Follow-up: Follow-up in Dr. Trudee Grip office [facility] [phone]) as previously scheduled  Disposition: 01-Home or Self Care     Medication List         albuterol 108 (90 BASE) MCG/ACT inhaler  Commonly known as:  PROVENTIL HFA;VENTOLIN HFA  Inhale 2 puffs into the lungs every 6 (six) hours as needed for wheezing or shortness of breath.     celecoxib 200 MG capsule  Commonly known as:  CELEBREX  Take 1 capsule (200 mg total) by mouth daily.  Start taking on:  12/14/2013     citalopram 40 MG tablet  Commonly known as:  CELEXA  Take 1 tablet (40 mg total) by mouth daily.     cyclobenzaprine 10 MG tablet  Commonly known as:  FLEXERIL  Take 1 tablet (10 mg total) by mouth 3 (three) times daily as needed for muscle spasms.     Fluticasone-Salmeterol 100-50 MCG/DOSE Aepb  Commonly known as:  ADVAIR  Inhale 1 puff into the lungs 2 (two) times daily.     HYDROmorphone 4 MG tablet  Commonly known as:  DILAUDID   Take 1 tablet (4 mg total) by mouth every 4 (four) hours as needed for severe pain.     lisinopril-hydrochlorothiazide 20-12.5 MG per tablet  Commonly known as:  PRINZIDE,ZESTORETIC  Take 1 tablet by mouth daily.         SignedLisbeth Renshaw, C 12/07/2013, 9:00 AM

## 2013-12-07 NOTE — Progress Notes (Signed)
Patient is discharged from room 4N01 at this time. Alert and in stable condition. IV site d/c'd. Instructions read to patient and wife and understanding verbalized. Left unit via wheelchair with belongings at side.

## 2013-12-11 ENCOUNTER — Telehealth: Payer: Self-pay | Admitting: Pulmonary Disease

## 2013-12-11 NOTE — Telephone Encounter (Signed)
Called pt to schedule Home Sleep Test.  Per wife, pt had back surgery on 12/04/13 and is unable to ride for 3 wks.  I advised the wife that we will put her husbands name at the bottom of the list & call him back in a few weeks when his name comes back up.  Pt's wife voiced understanding and said she would let the pt know Steven Murillo

## 2014-01-15 DIAGNOSIS — G4730 Sleep apnea, unspecified: Secondary | ICD-10-CM

## 2014-01-17 ENCOUNTER — Telehealth: Payer: Self-pay | Admitting: Pulmonary Disease

## 2014-01-17 NOTE — Telephone Encounter (Signed)
lmtcb x1 

## 2014-01-17 NOTE — Telephone Encounter (Signed)
Home study did not show sig OSA No intervention at this time Alcohol & wt gain can make this worse

## 2014-01-18 NOTE — Telephone Encounter (Signed)
Pt returning call.Steven Murillo ° °

## 2014-01-18 NOTE — Telephone Encounter (Signed)
lmtcb x2 w/ spouse 

## 2014-01-18 NOTE — Telephone Encounter (Signed)
Called pt at home #. Was advised to call pt cell. Called cell and LMTCB x1

## 2014-01-25 NOTE — Telephone Encounter (Signed)
I spoke with patient about results and he verbalized understanding and had no questions 

## 2014-02-01 DIAGNOSIS — G4730 Sleep apnea, unspecified: Secondary | ICD-10-CM

## 2014-02-05 ENCOUNTER — Encounter: Payer: Self-pay | Admitting: Pulmonary Disease

## 2014-02-11 ENCOUNTER — Encounter: Payer: Self-pay | Admitting: Internal Medicine

## 2014-02-11 ENCOUNTER — Ambulatory Visit (INDEPENDENT_AMBULATORY_CARE_PROVIDER_SITE_OTHER): Payer: 59 | Admitting: Internal Medicine

## 2014-02-11 VITALS — BP 126/78 | HR 102 | Temp 98.4°F | Wt 186.5 lb

## 2014-02-11 DIAGNOSIS — J4530 Mild persistent asthma, uncomplicated: Secondary | ICD-10-CM

## 2014-02-11 DIAGNOSIS — I10 Essential (primary) hypertension: Secondary | ICD-10-CM

## 2014-02-11 DIAGNOSIS — F411 Generalized anxiety disorder: Secondary | ICD-10-CM

## 2014-02-11 DIAGNOSIS — M5440 Lumbago with sciatica, unspecified side: Secondary | ICD-10-CM

## 2014-02-11 MED ORDER — LISINOPRIL-HYDROCHLOROTHIAZIDE 20-12.5 MG PO TABS: 1.0000 | ORAL_TABLET | Freq: Every day | ORAL | Status: AC

## 2014-02-11 MED ORDER — CITALOPRAM HYDROBROMIDE 40 MG PO TABS: 40.0000 mg | ORAL_TABLET | Freq: Every day | ORAL | Status: AC

## 2014-02-11 NOTE — Progress Notes (Signed)
Pre visit review using our clinic review tool, if applicable. No additional management support is needed unless otherwise documented below in the visit note. 

## 2014-02-11 NOTE — Patient Instructions (Signed)

## 2014-02-11 NOTE — Assessment & Plan Note (Addendum)
Improved after recent surgery Has follow up scheduled this Monday with Dr Gerlene FeeKritzer Will need referral placed for Dr Gerlene FeeKritzer- referral placed for insurance purpose

## 2014-02-11 NOTE — Assessment & Plan Note (Signed)
No issues on current regimen Will continue advair and albuterol

## 2014-02-11 NOTE — Assessment & Plan Note (Signed)
Well controlled on lisinopril HCT Reviewed cbc and cmet that was done 11/2013

## 2014-02-11 NOTE — Progress Notes (Signed)
Subjective:    Patient ID: Steven Murillo, male    DOB: 12/11/1975, 38 y.o.   MRN: [id number]  HPI  Pt presents to the clinic today to follow up chronic conditions.  Anxiety: Has been well controlled on current dose of Celexa.  Asthma: No current issues on advair and albuterol. Only uses albuterol a few times per month.  HTN: BP well controlled on lisinopril-hct. Tolerating medication well without side effects.  Chronic back pain: Just underwent repeat surgery by kritzer. Has noticed much improvement in symptoms.  Review of Systems      Past Medical History  Diagnosis Date  . Anxiety   . Asthma     daily and prn inhalers  . Hypertension     started on BP med. first week of July 2015  . Medial meniscus tear 09/2013    right knee  . 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 Pneumonia ~ 2010; 2014  . Chronic bronchitis     "maybe not q yr; comes from my asthma" (12/05/2013)  . Osteoarthritis of back   . Chronic lower back pain     Current Outpatient Prescriptions  Medication Sig Dispense Refill  . albuterol (PROVENTIL HFA;VENTOLIN HFA) 108 (90 BASE) MCG/ACT inhaler Inhale 2 puffs into the lungs every 6 (six) hours as needed for wheezing or shortness of breath.     . citalopram (CELEXA) 40 MG tablet Take 1 tablet (40 mg total) by mouth daily. 30 tablet 5  . cyclobenzaprine (FLEXERIL) 10 MG tablet Take 1 tablet (10 mg total) by mouth 3 (three) times daily as needed for muscle spasms. 50 tablet 0  . Fluticasone-Salmeterol (ADVAIR) 100-50 MCG/DOSE AEPB Inhale 1 puff into the lungs 2 (two) times daily.    Marland Kitchen. lisinopril-hydrochlorothiazide (PRINZIDE,ZESTORETIC) 20-12.5 MG per tablet Take 1 tablet by mouth daily. 30 tablet 2   No current facility-administered medications for this visit.    Allergies  Allergen Reactions  . Contrast Media [Iodinated Diagnostic Agents]     vomiting    Family History  Problem Relation Age of Onset  . Sleep apnea Father   . Diabetes Father     . Heart disease Father   . Alcohol abuse Brother   . Alcohol abuse Paternal Grandfather   . Asthma Maternal Grandmother     History   Social History  . Marital Status: Married    Spouse Name: N/A    Number of Children: 1  . Years of Education: N/A   Occupational History  . [employer]    Social History Main Topics  . Smoking status: Never Smoker   . Smokeless tobacco: Never Used  . Alcohol Use: 2.4 oz/week    4 Cans of beer per week  . Drug Use: No  . Sexual Activity: Yes   Other Topics Concern  . Not on file   Social History Narrative     Constitutional: Denies fever, malaise, fatigue, headache or abrupt weight changes.  Respiratory: Denies difficulty breathing, shortness of breath, cough or sputum production.   Cardiovascular: Denies chest pain, chest tightness, palpitations or swelling in the hands or feet.  Musculoskeletal: Pt reports decrease in ROM. Denies difficulty with gait, muscle pain or joint pain and swelling.  BP 126/78 mmHg  Pulse 102  Temp(Src) 98.4 F (36.9 C) (Oral)  Wt 186 lb 8 oz (84.596 kg)  SpO2 98% Wt Readings from Last 3 Encounters:  02/11/14 186 lb 8 oz (84.596 kg)  12/04/13 188 lb (85.276  kg)  11/23/13 188 lb 1.6 oz (85.322 kg)    General: Appears his stated age, well developed, well nourished in NAD. Cardiovascular: Normal rate and rhythm. S1,S2 noted.  No murmur, rubs or gallops noted.  Pulmonary/Chest: Normal effort and positive vesicular breath sounds. No respiratory distress. No wheezes, rales or ronchi noted.  Musculoskeletal: Normal flexion of the lumbar spine- he is wearing a back brace today. No difficulty with gait.  Neurological: Alert and oriented.   BMET    Component Value Date/Time   NA 136* 11/23/2013 1126   K 4.7 11/23/2013 1126   CL 96 11/23/2013 1126   CO2 26 11/23/2013 1126   GLUCOSE 92 11/23/2013 1126   BUN 15 11/23/2013 1126   CREATININE 0.88 11/23/2013 1126   CREATININE 0.84 09/27/2013 1228   CALCIUM  10.4 11/23/2013 1126   GFRNONAA >90 11/23/2013 1126   GFRAA >90 11/23/2013 1126    Lipid Panel  No results found for: CHOL, TRIG, HDL, CHOLHDL, VLDL, LDLCALC  CBC    Component Value Date/Time   WBC 5.1 11/23/2013 1126   RBC 5.20 11/23/2013 1126   HGB 14.8 11/23/2013 1126   HCT 42.8 11/23/2013 1126   PLT 310 11/23/2013 1126   MCV 82.3 11/23/2013 1126   MCH 28.5 11/23/2013 1126   MCHC 34.6 11/23/2013 1126   RDW 12.6 11/23/2013 1126    Hgb A1C No results found for: HGBA1C   Neurological: Denies dizziness, difficulty with memory, difficulty with speech or problems with balance and coordination.   No other specific complaints in a complete review of systems (except as listed in HPI above).  Objective:   Physical Exam        Assessment & Plan:

## 2014-04-11 ENCOUNTER — Encounter (HOSPITAL_BASED_OUTPATIENT_CLINIC_OR_DEPARTMENT_OTHER): Payer: Self-pay | Admitting: Orthopaedic Surgery

## 2014-05-23 ENCOUNTER — Telehealth: Payer: Self-pay

## 2014-05-23 NOTE — Telephone Encounter (Signed)
Chris left v/m; for pt to pay out of pocket for Advair is $300.00; wants to know if has sample or coupon for Advair; ins starts 05/28/14. Pt has been out of med for 2 weeks. Chris request cb.

## 2014-05-24 NOTE — Telephone Encounter (Signed)
We do not have any coupons--however i went on the Advair website--as any person can do if they have access to computer--printed off Rx coupon card will place in front office to pick up if he desires--called pt no answer and VM box was full--

## 2014-10-03 ENCOUNTER — Telehealth: Payer: Self-pay | Admitting: *Deleted

## 2014-10-03 NOTE — Telephone Encounter (Signed)
noted 

## 2014-10-03 NOTE — Telephone Encounter (Signed)
FYI- I am friends with his wife and found out that he passed away earlier this week. I'm not sure what happened, but I wanted to make you aware in case you wanted to express your condolencences.

## 2014-10-28 DEATH — deceased

## 2016-04-26 IMAGING — RF DG C-ARM 61-120 MIN
1 series · 2 of 2 positions shown · non-contrast
Comparison: MRI lumbar spine 06/14/2013

CLINICAL DATA: PLIF of L4 and L5.

EXAM:
DG C-ARM 61-120 MIN; LUMBAR SPINE - 2-3 VIEW
:

[Series 1: run · 2 of 2 slices shown]
[im 1/2]
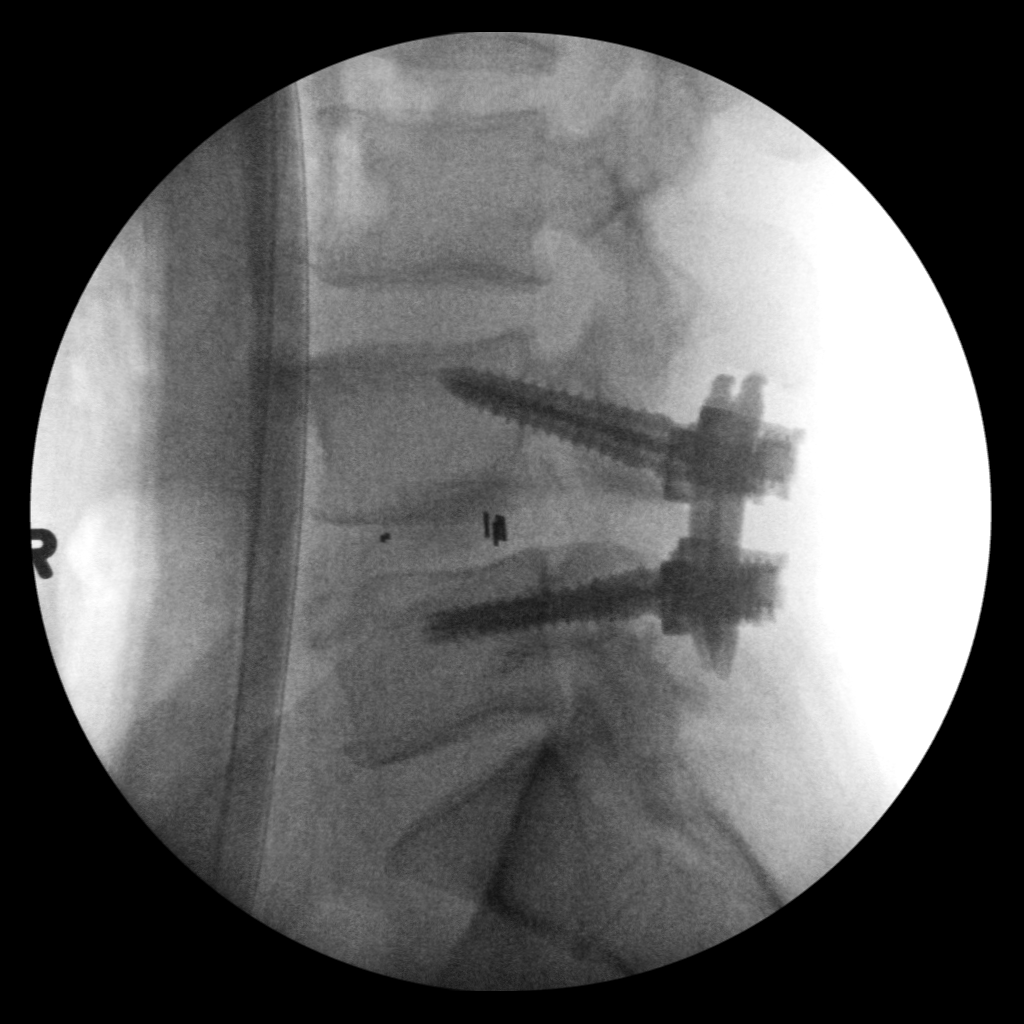
[im 2/2]
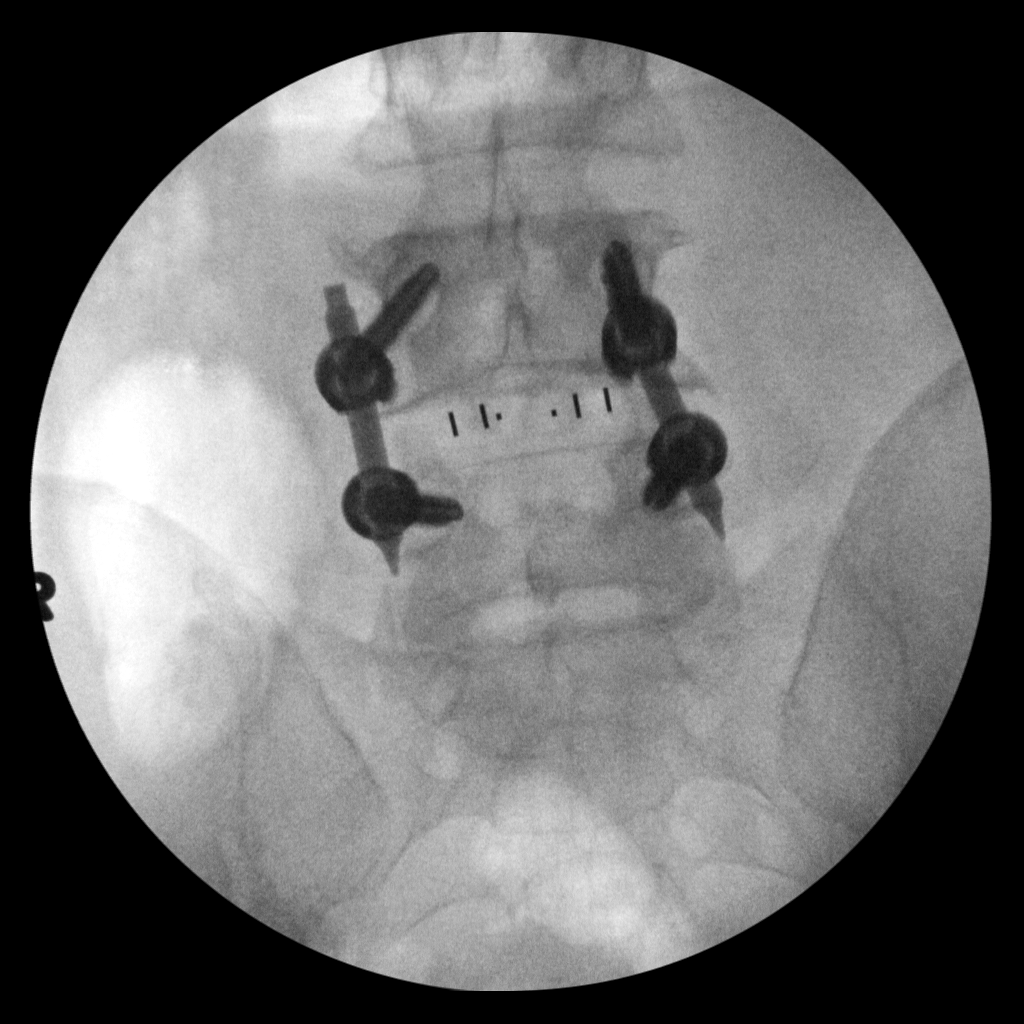

[2 of 2 positions shown; findings below may reference images not displayed]

FINDINGS: AP and lateral spot fluoroscopic views of the lower lumbar spine
centered at L4-L5 demonstrate posterior laminectomy and interbody
fusion at L4-L5. Pedicle screws and posterior rods appear intact.
Alignment of the vertebral bodies appears normal. No complicating
features identified.
IMPRESSION: Intraoperative spot fluoroscopic views demonstrate PLIF at L4-L5.
